# Patient Record
Sex: Female | Born: 1995 | Race: Black or African American
Health system: Southern US, Community
[De-identification: ages and names within clinical notes are randomized; demographics above are authoritative.]

## PROBLEM LIST (undated history)

## (undated) ENCOUNTER — Emergency Department
Admission: EM | Discharge status: Against Medical Advice | Payer: Enrolled Prime—HMO | Attending: Emergency Medicine | Admitting: Emergency Medicine

## (undated) DIAGNOSIS — F32A Depression, unspecified: Secondary | ICD-10-CM

## (undated) DIAGNOSIS — F419 Anxiety disorder, unspecified: Secondary | ICD-10-CM

## (undated) DIAGNOSIS — N390 Urinary tract infection, site not specified: Secondary | ICD-10-CM

## (undated) DIAGNOSIS — R519 Headache, unspecified: Secondary | ICD-10-CM

## (undated) DIAGNOSIS — R51 Headache: Secondary | ICD-10-CM

## (undated) DIAGNOSIS — J45909 Unspecified asthma, uncomplicated: Secondary | ICD-10-CM

## (undated) DIAGNOSIS — J302 Other seasonal allergic rhinitis: Secondary | ICD-10-CM

## (undated) DIAGNOSIS — T781XXA Other adverse food reactions, not elsewhere classified, initial encounter: Secondary | ICD-10-CM

## (undated) DIAGNOSIS — J309 Allergic rhinitis, unspecified: Secondary | ICD-10-CM

## (undated) DIAGNOSIS — H1010 Acute atopic conjunctivitis, unspecified eye: Secondary | ICD-10-CM

## (undated) HISTORY — DX: Unspecified asthma, uncomplicated: J45.909

## (undated) HISTORY — DX: Headache, unspecified: R51.9

## (undated) HISTORY — PX: ANTERIOR CRUCIATE LIGAMENT REPAIR: SHX115

## (undated) HISTORY — DX: Headache: R51

## (undated) HISTORY — DX: Urinary tract infection, site not specified: N39.0

## (undated) HISTORY — DX: Acute atopic conjunctivitis, unspecified eye: H10.10

## (undated) HISTORY — DX: Other seasonal allergic rhinitis: J30.2

## (undated) HISTORY — DX: Other adverse food reactions, not elsewhere classified, initial encounter: T78.1XXA

## (undated) HISTORY — DX: Allergic rhinitis, unspecified: J30.9

## (undated) HISTORY — DX: Anxiety disorder, unspecified: F41.9

## (undated) HISTORY — PX: OTHER SURGICAL HISTORY: SHX169

---
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
## 2017-11-13 ENCOUNTER — Encounter: Payer: Self-pay | Admitting: Nurse Practitioner

## 2017-11-13 ENCOUNTER — Ambulatory Visit (INDEPENDENT_AMBULATORY_CARE_PROVIDER_SITE_OTHER): Payer: 59 | Admitting: Nurse Practitioner

## 2017-11-13 ENCOUNTER — Other Ambulatory Visit (HOSPITAL_COMMUNITY)
Admission: RE | Admit: 2017-11-13 | Discharge: 2017-11-13 | Disposition: A | Discharge status: Home / Self Care | Payer: 59 | Source: Ambulatory Visit | Attending: Nurse Practitioner | Admitting: Nurse Practitioner

## 2017-11-13 VITALS — BP 118/78 | HR 107 | Temp 98.3°F | Ht 70.0 in | Wt 156.0 lb

## 2017-11-13 DIAGNOSIS — A749 Chlamydial infection, unspecified: Secondary | ICD-10-CM | POA: Insufficient documentation

## 2017-11-13 DIAGNOSIS — B373 Candidiasis of vulva and vagina: Secondary | ICD-10-CM | POA: Diagnosis not present

## 2017-11-13 DIAGNOSIS — B9689 Other specified bacterial agents as the cause of diseases classified elsewhere: Secondary | ICD-10-CM | POA: Diagnosis not present

## 2017-11-13 DIAGNOSIS — N760 Acute vaginitis: Secondary | ICD-10-CM | POA: Diagnosis not present

## 2017-11-13 DIAGNOSIS — B3731 Acute candidiasis of vulva and vagina: Secondary | ICD-10-CM

## 2017-11-13 NOTE — Patient Instructions (Addendum)
Maintain appt with psychology.  Positive for BV, yeast and chalmydia. Diflucan, metrogel and azithromycin sent. Return to lab in 1week for repeat urine cytology  Coping With Depression, Teen Depression is an experience of feeling down, blue, or sad. Depression can affect your thoughts and feelings, relationships, daily activities, and physical health. It is caused by changes in your brain that can be triggered by stress in your life or a serious loss. Everyone experiences occasional disappointment, sadness, and loss in their lives. When you are feeling down, blue, or sad for at least 2 weeks in a row, it may mean that you have depression. If you receive a diagnosis of depression, your health care provider will tell you which type of depression you have and the possible treatments to help. How can depression affect me? Being depressed can make daily activities more difficult. It can negatively affect your daily life, from school and sports performance to work and relationships. When you are depressed, you may:  Want to be alone.  Avoid interacting with others.  Avoid doing the things you usually like to do.  Notice changes in your sleep habits.  Find it harder than usual to wake up and go to school or work.  Feel angry at everyone.  Feel like you do not have any patience.  Have trouble concentrating.  Feel tired all the time.  Notice changes in your appetite.  Lose or gain weight without trying.  Have constant headaches or stomachaches.  Think about death or attempting suicide often.  What are things I can do to deal with depression? If you have had symptoms of depression for more than 2 weeks, talk with your parents or an adult you trust, such as a Veterinary surgeon at school or church or a Psychologist, occupational. You might be tempted to only tell friends, but you should tell an adult too. The hardest step in dealing with depression is admitting that you are feeling it to someone. The more people who  know, the more likely you will be to get some help. Certain types of counseling can be very helpful in treating depression. A counseling professional can assess what treatments are going to be most helpful for you. These may include:  Talk therapy.  Medicines.  Brain stimulation therapy.  There are a number of other things you can do that can help you cope with depression on a daily basis, including:  Spending time in nature.  Spending time with trusted friends who help you feel better.  Taking time to think about the positive things in your life and to feel grateful for them.  Exercising, such as playing an active game with some friends or going for a run.  Spending less time using electronics, especially at night before bed. The screens of TVs, computers, tablets, and phones make your brain think it is time to get up rather than go to bed.  Avoiding spending too much time spacing out on TV or video games. This might feel good for a while, but it ends up just being a way to avoid the feelings of depression.  What should I do if my depression gets worse? If you are having trouble managing your depression or if your depression gets worse, talk to your health care provider about making adjustments to your treatment plan. You should get help immediately if:  You feel suicidal and are making a plan to commit suicide.  You are drinking or using drugs to stop the pain from your depression.  You are cutting yourself or thinking about cutting yourself.  You are thinking about hurting others and are making a plan to do so.  You believe the world would be better off without you in it.  You are isolating yourself completely and not talking with anyone.  If you find yourself in any of these situations, you should do one of the following:  Immediately tell your parents or best friend.  Call and go see your health care provider or health professional.  Call the suicide prevention  hotline (307-742-7347 in the [place]  Text the crisis line 250-162-4259 in the [place]  Where can I get support? It is important to know that although depression is serious, you can find support from a variety of sources. Sources of help may include:  Suicide prevention, crisis prevention, and depression hotlines.  School teachers, counselors, Systems developer, or clergy.  Parents or other family members.  Support groups.  You can locate a counselor or support group in your area from one of the following sources:  Mental Health America: www.mentalhealthamerica.net  Anxiety and Depression Association of [place] (ADAA): ProgramCam.de  The [employer] on Mental Illness (NAMI): www.nami.org  This information is not intended to replace advice given to you by your health care provider. Make sure you discuss any questions you have with your health care provider. Document Released: 02/03/2015 Document Revised: 06/22/2015 Document Reviewed: 02/03/2015 Elsevier Interactive Patient Education  [employer].

## 2017-11-13 NOTE — Progress Notes (Signed)
Subjective:  Patient ID: Megan Elliott, female    DOB: 1995-05-16  Age: 22 y.o. MRN: [id number]  CC: Establish Care (vaginal itching and dysuria)   HPI  urinary and vaginal symptoms: Current symptoms: itching, burning, no discharge since 11/01/2017. Denies any douching. LMP 10/05-10/11/2017. nexplanon inserted 06/2015. Irregular and menorrhagia.  Also reports anxiety and depression related to strained relationship with best friend. appt with psychology:11/21/2017. Depression screen [facility] 2/9 11/13/2017 11/13/2017  Decreased Interest 2 1  Down, Depressed, Hopeless 1 1  PHQ - 2 Score 3 2  Altered sleeping 1 -  Tired, decreased energy 2 -  Change in appetite 2 -  Feeling bad or failure about yourself  1 -  Trouble concentrating 3 -  Moving slowly or fidgety/restless 1 -  Suicidal thoughts 0 -  PHQ-9 Score 13 -   GAD 7 : Generalized Anxiety Score 11/13/2017  Nervous, Anxious, on Edge 3  Control/stop worrying 3  Worry too much - different things 3  Trouble relaxing 3  Restless 2  Easily annoyed or irritable 3  Afraid - awful might happen 2  Total GAD 7 Score 19   Reviewed past Medical, Social and Family history today.  Outpatient Medications Prior to Visit  Medication Sig Dispense Refill  . etonogestrel (NEXPLANON) 68 MG IMPL implant Nexplanon 68 mg subdermal implant     No facility-administered medications prior to visit.     ROS See HPI  Objective:  BP 118/78   Pulse (!) 107   Temp 98.3 F (36.8 C) (Oral)   Ht 5\' 10"  (1.778 m)   Wt 156 lb (70.8 kg)   SpO2 98%   BMI 22.38 kg/m   BP Readings from Last 3 Encounters:  11/13/17 118/78    Wt Readings from Last 3 Encounters:  11/13/17 156 lb (70.8 kg)    Physical Exam  Constitutional: She appears well-developed and well-nourished.  Cardiovascular: Normal rate.  Pulmonary/Chest: Effort normal.  Genitourinary: Pelvic exam was performed with patient supine. There is no rash, tenderness or lesion on the  right labia. There is no rash, tenderness or lesion on the left labia. Right adnexum displays no tenderness. Left adnexum displays no tenderness. There is erythema in the vagina. Vaginal discharge found.  Genitourinary Comments: Chaperone present  Lymphadenopathy: No inguinal adenopathy noted on the right or left side.  Vitals reviewed.  No results found for: WBC, HGB, HCT, PLT, GLUCOSE, CHOL, TRIG, HDL, LDLDIRECT, LDLCALC, ALT, AST, NA, K, CL, CREATININE, BUN, CO2, TSH, PSA, INR, GLUF, HGBA1C, MICROALBUR  Assessment & Plan:   Megan Elliott was seen today for establish care.  Diagnoses and all orders for this visit:  BV (bacterial vaginosis) -     Cervicovaginal ancillary only( [place]) -     Urinalysis w microscopic + reflex cultur -     metroNIDAZOLE (METROGEL VAGINAL) 0.75 % vaginal gel; Place 1 Applicatorful vaginally 2 (two) times daily.  Chlamydia infection -     Urinalysis w microscopic + reflex cultur -     REFLEXIVE URINE CULTURE -     Urine cytology ancillary only([place]); Future -     azithromycin (ZITHROMAX) 500 MG tablet; Take 2 tablets (1,000 mg total) by mouth once for 1 dose.  Yeast vaginitis -     fluconazole (DIFLUCAN) 150 MG tablet; Take 1 tablet (150 mg total) by mouth daily. Take second tab 3days apart from first tab   I am having Megan Elliott start on fluconazole, metroNIDAZOLE, and azithromycin. I am  also having her maintain her etonogestrel.  Meds ordered this encounter  Medications  . fluconazole (DIFLUCAN) 150 MG tablet    Sig: Take 1 tablet (150 mg total) by mouth daily. Take second tab 3days apart from first tab    Dispense:  2 tablet    Refill:  0    Order Specific Question:   Supervising Provider    Answer:   Dianne Dun [[id number]]  . metroNIDAZOLE (METROGEL VAGINAL) 0.75 % vaginal gel    Sig: Place 1 Applicatorful vaginally 2 (two) times daily.    Dispense:  70 g    Refill:  0    Order Specific Question:   Supervising Provider    Answer:    Dianne Dun [[id number]]  . azithromycin (ZITHROMAX) 500 MG tablet    Sig: Take 2 tablets (1,000 mg total) by mouth once for 1 dose.    Dispense:  2 tablet    Refill:  0    Order Specific Question:   Supervising Provider    Answer:   Dianne Dun [[id number]]    Follow-up: Return in about 2 weeks (around 11/27/2017) for CPE(fasting) and mood disorder.  [name], NP

## 2017-11-14 ENCOUNTER — Telehealth: Payer: Self-pay

## 2017-11-14 LAB — URINALYSIS W MICROSCOPIC + REFLEX CULTURE
BACTERIA UA: NONE SEEN /HPF
Bilirubin Urine: NEGATIVE
Glucose, UA: NEGATIVE
Hgb urine dipstick: NEGATIVE
Hyaline Cast: NONE SEEN /LPF
KETONES UR: NEGATIVE
LEUKOCYTE ESTERASE: NEGATIVE
Nitrites, Initial: NEGATIVE
PROTEIN: NEGATIVE
RBC / HPF: NONE SEEN /HPF (ref 0–2)
SPECIFIC GRAVITY, URINE: 1.031 (ref 1.001–1.03)
pH: 7 (ref 5.0–8.0)

## 2017-11-14 LAB — CERVICOVAGINAL ANCILLARY ONLY
BACTERIAL VAGINITIS: POSITIVE — AB
CHLAMYDIA, DNA PROBE: POSITIVE — AB
Candida vaginitis: POSITIVE — AB
Neisseria Gonorrhea: NEGATIVE
Trichomonas: NEGATIVE

## 2017-11-14 LAB — NO CULTURE INDICATED

## 2017-11-14 NOTE — Telephone Encounter (Signed)
I understand. Proceed with the other tests

## 2017-11-14 NOTE — Telephone Encounter (Signed)
CN-Megan Elliott at [facility] stated that she cannot run the HPV that was ordered with the swab sent to them/she said that you can use the Thin Prep vials for all of the tests including the HPV without actually doing a PAP FYI/thx dmf

## 2017-11-17 ENCOUNTER — Encounter: Payer: Self-pay | Admitting: Nurse Practitioner

## 2017-11-17 MED ORDER — AZITHROMYCIN 500 MG PO TABS: 1000.0000 mg | ORAL_TABLET | Freq: Once | ORAL | 0 refills | Status: AC

## 2017-11-17 MED ORDER — FLUCONAZOLE 150 MG PO TABS: 150.0000 mg | ORAL_TABLET | Freq: Every day | ORAL | 0 refills | Status: DC

## 2017-11-17 MED ORDER — METRONIDAZOLE 0.75 % VA GEL: 1.0000 | Freq: Two times a day (BID) | VAGINAL | 0 refills | Status: DC

## 2017-11-24 ENCOUNTER — Other Ambulatory Visit (HOSPITAL_COMMUNITY)
Admission: RE | Admit: 2017-11-24 | Discharge: 2017-11-24 | Disposition: A | Discharge status: Home / Self Care | Source: Ambulatory Visit | Attending: Nurse Practitioner | Admitting: Nurse Practitioner

## 2017-11-24 ENCOUNTER — Other Ambulatory Visit (INDEPENDENT_AMBULATORY_CARE_PROVIDER_SITE_OTHER): Payer: 59

## 2017-11-24 DIAGNOSIS — B373 Candidiasis of vulva and vagina: Secondary | ICD-10-CM | POA: Insufficient documentation

## 2017-11-24 DIAGNOSIS — N760 Acute vaginitis: Secondary | ICD-10-CM | POA: Insufficient documentation

## 2017-11-24 DIAGNOSIS — B9689 Other specified bacterial agents as the cause of diseases classified elsewhere: Secondary | ICD-10-CM | POA: Diagnosis present

## 2017-11-24 DIAGNOSIS — A749 Chlamydial infection, unspecified: Secondary | ICD-10-CM

## 2017-11-25 LAB — URINE CYTOLOGY ANCILLARY ONLY
Chlamydia: NEGATIVE
NEISSERIA GONORRHEA: NEGATIVE
TRICH (WINDOWPATH): NEGATIVE

## 2017-11-27 ENCOUNTER — Encounter: Payer: Self-pay | Admitting: Nurse Practitioner

## 2017-11-27 ENCOUNTER — Ambulatory Visit (INDEPENDENT_AMBULATORY_CARE_PROVIDER_SITE_OTHER): Payer: 59 | Admitting: Nurse Practitioner

## 2017-11-27 VITALS — BP 120/72 | HR 69 | Temp 99.0°F | Ht 70.0 in | Wt 162.0 lb

## 2017-11-27 DIAGNOSIS — R590 Localized enlarged lymph nodes: Secondary | ICD-10-CM | POA: Diagnosis not present

## 2017-11-27 DIAGNOSIS — F329 Major depressive disorder, single episode, unspecified: Secondary | ICD-10-CM

## 2017-11-27 DIAGNOSIS — J302 Other seasonal allergic rhinitis: Secondary | ICD-10-CM | POA: Diagnosis not present

## 2017-11-27 DIAGNOSIS — Z114 Encounter for screening for human immunodeficiency virus [HIV]: Secondary | ICD-10-CM

## 2017-11-27 DIAGNOSIS — Z7251 High risk heterosexual behavior: Secondary | ICD-10-CM | POA: Diagnosis not present

## 2017-11-27 DIAGNOSIS — Z0001 Encounter for general adult medical examination with abnormal findings: Secondary | ICD-10-CM | POA: Diagnosis not present

## 2017-11-27 DIAGNOSIS — F32A Depression, unspecified: Secondary | ICD-10-CM | POA: Insufficient documentation

## 2017-11-27 MED ORDER — ESCITALOPRAM OXALATE 10 MG PO TABS: 10.0000 mg | ORAL_TABLET | Freq: Every day | ORAL | 1 refills | Status: DC

## 2017-11-27 MED ORDER — SALINE SPRAY 0.65 % NA SOLN: 1.0000 | NASAL | 0 refills | Status: DC | PRN

## 2017-11-27 MED ORDER — CETIRIZINE HCL 10 MG PO TABS: 10.0000 mg | ORAL_TABLET | Freq: Every day | ORAL | 0 refills | Status: DC

## 2017-11-27 MED ORDER — FLUTICASONE PROPIONATE 50 MCG/ACT NA SUSP: 2.0000 | Freq: Every day | NASAL | 0 refills | Status: DC

## 2017-11-27 NOTE — Patient Instructions (Addendum)
I instructed pt to start 1/2 tablet once daily for 1 week and then increase to a full tablet once daily on week two as tolerated.   We discussed common side effects such as nausea, drowsiness and weight gain.  Also discussed rare but serious side effect of suicide ideation.  She is instructed to discontinue medication go directly to ED if this occurs.  Pt verbalizes understanding.   Plan follow up in 2weeks to evaluate progress.    Normal lab results.  Preventive Care for [place], Female The transition to life after high school as a young adult can be a stressful time with many changes. You may start seeing a primary care physician instead of a pediatrician. This is the time when your health care becomes your responsibility. Preventive care refers to lifestyle choices and visits with your health care provider that can promote health and wellness. What does preventive care include?  A yearly physical exam. This is also called an annual wellness visit.  Dental exams once or twice a year.  Routine eye exams. Ask your health care provider how often you should have your eyes checked.  Personal lifestyle choices, including: ? Daily care of your teeth and gums. ? Regular physical activity. ? Eating a healthy diet. ? Avoiding tobacco and drug use. ? Avoiding or limiting alcohol use. ? Practicing safe sex. ? Taking vitamin and mineral supplements as recommended by your health care provider. What happens during an annual wellness visit? Preventive care starts with a yearly visit to your primary care physician. The services and screenings done by your health care provider during your annual wellness visit will depend on your overall health, lifestyle risk factors, and family history of disease. Counseling Your health care provider may ask you questions about:  Past medical problems and your family's medical history.  Medicines or supplements you take.  Health insurance and access to  health care.  Alcohol, tobacco, and drug use.  Your safety at home, work, or school.  Access to firearms.  Emotional well-being and how you cope with stress.  Relationship well-being.  Diet, exercise, and sleep habits.  Your sexual health and activity.  Your methods of birth control.  Your menstrual cycle.  Your pregnancy history.  Screening You may have the following tests or measurements:  Height, weight, and BMI.  Blood pressure.  Lipid and cholesterol levels.  Tuberculosis skin test.  Skin exam.  Vision and hearing tests.  Screening test for hepatitis.  Screening tests for sexually transmitted diseases (STDs), if you are at risk.  BRCA-related cancer screening. This may be done if you have a family history of breast, ovarian, tubal, or peritoneal cancers.  Pelvic exam and Pap test. This may be done every 3 years starting at age 1.  Vaccines Your health care provider may recommend certain vaccines, such as:  Influenza vaccine. This is recommended every year.  Tetanus, diphtheria, and acellular pertussis (Tdap, Td) vaccine. You may need a Td booster every 10 years.  Varicella vaccine. You may need this if you have not been vaccinated.  HPV vaccine. If you are 65 or younger, you may need three doses over 6 months.  Measles, mumps, and rubella (MMR) vaccine. You may need at least one dose of MMR. You may also need a second dose.  Pneumococcal 13-valent conjugate (PCV13) vaccine. You may need this if you have certain conditions and were not previously vaccinated.  Pneumococcal polysaccharide (PPSV23) vaccine. You may need one or two doses if  you smoke cigarettes or if you have certain conditions.  Meningococcal vaccine. One dose is recommended if you are age 20-21 years and a first-year college student living in a residence hall, or if you have one of several medical conditions. You may also need additional booster doses.  Hepatitis A vaccine. You may  need this if you have certain conditions or if you travel or work in places where you may be exposed to hepatitis A.  Hepatitis B vaccine. You may need this if you have certain conditions or if you travel or work in places where you may be exposed to hepatitis B.  Haemophilus influenzae type b (Hib) vaccine. You may need this if you have certain risk factors.  Talk to your health care provider about which screenings and vaccines you need and how often you need them. What steps can I take to develop healthy behaviors?  Have regular preventive health care visits with your primary care physician and dentist.  Eat a healthy diet.  Drink enough fluid to keep your urine clear or pale yellow.  Stay active. Exercise at least 30 minutes 5 or more days of the week.  Use alcohol responsibly.  Maintain a healthy weight.  Do not use any products that contain nicotine, such as cigarettes, chewing tobacco, and e-cigarettes. If you need help quitting, ask your health care provider.  Do not use drugs.  Practice safe sex.  Use birth control (contraception) to prevent unwanted pregnancy. If you plan to become pregnant, see your health care provider for a pre-conception visit.  Find healthy ways to manage stress. How can I protect myself from injury? Injuries from violence or accidents are the leading cause of death among young adults and can often be prevented. Take these steps to help protect yourself:  Always wear your seat belt while driving or riding in a vehicle.  Do not drive if you have been drinking alcohol. Do not ride with someone who has been drinking.  Do not drive when you are tired or distracted. Do not text while driving.  Wear a helmet and other protective equipment during sports activities.  If you have firearms in your house, make sure you follow all gun safety procedures.  Seek help if you have been bullied, physically abused, or sexually abused.  Use the Internet  responsibly to avoid dangers such as online bullying and online sexual predators.  What can I do to cope with stress? Young adults may face many new challenges that can be stressful, such as finding a job, going to college, moving away from home, managing money, being in a relationship, getting married, and having children. To manage stress:  Avoid known stressful situations when you can.  Exercise regularly.  Find a stress-reducing activity that works best for you. Examples include meditation, yoga, listening to music, or reading.  Spend time in nature.  Keep a journal to write about your stress and how you respond.  Talk to your health care provider about stress. He or she may suggest counseling.  Spend time with supportive friends or family.  Do not cope with stress by: ? Drinking alcohol or using drugs. ? Smoking cigarettes. ? Eating.  Where can I get more information? Learn more about preventive care and healthy habits from:  [place] and Gynecologists: KaraokeExchange.nl  U.S. Probation officer Task Force: StageSync.si  National Adolescent and [place]: StrategicRoad.nl  American Academy of Pediatrics Bright Futures: https://brightfutures.MemberVerification.co.za  Society for Adolescent Health  and Medicine: MoralBlog.co.za.aspx  PodExchange.nl: ToyLending.fr  This information is not intended to replace advice given to you by your health care provider. Make sure you discuss any questions you have with your health care provider. Document Released: 06/01/2015 Document Revised: 06/22/2015 Document Reviewed: 06/01/2015 Elsevier Interactive Patient Education  [employer].

## 2017-11-27 NOTE — Progress Notes (Signed)
Subjective:    Patient ID: Megan Elliott, female    DOB: [birth year], 22 y.o.   MRN: [id number]  Patient presents today for complete physical and depression evaluation.  HPI  Sexual History (orientation,birth control, marital status, STD): sexually active, unprotected, reports partner has not yet been treated.  Depression/Suicide:  Ongoing for several months, waxing and waning Related to strained relationship with high school friend and boyfriend. Denies any hx of abuse (sexual, physical, emotional or verbal) Denies any drug use. Seen by psychology: Charlcie Cradle in [place], next appt 12/12/2017. Depression screen [facility] 2/9 11/13/2017 11/13/2017  Decreased Interest 2 1  Down, Depressed, Hopeless 1 1  PHQ - 2 Score 3 2  Altered sleeping 1 -  Tired, decreased energy 2 -  Change in appetite 2 -  Feeling bad or failure about yourself  1 -  Trouble concentrating 3 -  Moving slowly or fidgety/restless 1 -  Suicidal thoughts 0 -  PHQ-9 Score 13 -   Vision:up to date  Dental:up to date  Immunizations: (TDAP, Hep C screen, Pneumovax, Influenza, zoster)  Health Maintenance  Topic Date Due  . HIV Screening  08/28/2012  . Flu Shot  04/27/2018*  . Chlamydia screening  11/14/2018  . Tetanus Vaccine  07/16/2025  *Topic was postponed. The date shown is not the original due date.   Diet:regular.  Weight:  Wt Readings from Last 3 Encounters:  11/27/17 162 lb (73.5 kg)  11/13/17 156 lb (70.8 kg)   Exercise:none  Fall Risk: Fall Risk  11/13/2017  Falls in the past year? No    Medications and allergies reviewed with patient and updated if appropriate.  Patient Active Problem List   Diagnosis Date Noted  . Depression 11/27/2017  . Posterior cervical lymphadenopathy 11/27/2017  . High-risk sexual behavior 11/27/2017    Current Outpatient Medications on File Prior to Visit  Medication Sig Dispense Refill  . etonogestrel (NEXPLANON) 68 MG IMPL implant Nexplanon 68 mg  subdermal implant     No current facility-administered medications on file prior to visit.     Past Medical History:  Diagnosis Date  . Asthma   . Headache   . UTI (urinary tract infection)     No past surgical history on file.  Social History   Socioeconomic History  . Marital status: Single    Spouse name: Not on file  . Number of children: 0  . Years of education: Not on file  . Highest education level: Not on file  Occupational History    Comment: Pharmacist, hospital  Social Needs  . Financial resource strain: Not on file  . Food insecurity:    Worry: Not on file    Inability: Not on file  . Transportation needs:    Medical: Not on file    Non-medical: Not on file  Tobacco Use  . Smoking status: Never Smoker  . Smokeless tobacco: Never Used  Substance and Sexual Activity  . Alcohol use: Yes    Comment: social  . Drug use: Never  . Sexual activity: Yes    Birth control/protection: Implant  Lifestyle  . Physical activity:    Days per week: Not on file    Minutes per session: Not on file  . Stress: Not on file  Relationships  . Social connections:    Talks on phone: More than three times a week    Gets together: More than three times a week    Attends religious service: Patient refused  Active member of club or organization: No    Attends meetings of clubs or organizations: Never    Relationship status: Not on file  Other Topics Concern  . Not on file  Social History Narrative  . Not on file    Family History  Problem Relation Age of Onset  . Hypertension Father   . Diabetes Maternal Grandmother   . Epilepsy Paternal Grandmother         Review of Systems  Constitutional: Negative for fever, malaise/fatigue and weight loss.  HENT: Positive for congestion. Negative for sore throat.   Eyes:       Negative for visual changes  Respiratory: Negative for cough and shortness of breath.   Cardiovascular: Negative for chest pain,  palpitations and leg swelling.  Gastrointestinal: Negative for blood in stool, constipation, diarrhea and heartburn.  Genitourinary: Negative for dysuria, frequency and urgency.  Musculoskeletal: Negative for falls, joint pain and myalgias.  Skin: Negative for rash.  Neurological: Negative for dizziness, sensory change and headaches.  Endo/Heme/Allergies: Does not bruise/bleed easily.  Psychiatric/Behavioral: Negative for depression, substance abuse and suicidal ideas. The patient is not nervous/anxious.     Objective:   Vitals:   11/27/17 1501  BP: 120/72  Pulse: 69  Temp: 99 F (37.2 C)  SpO2: 99%    Body mass index is 23.24 kg/m.   Physical Examination:  Physical Exam  Constitutional: She is oriented to person, place, and time.  HENT:  Right Ear: Tympanic membrane, external ear and ear canal normal.  Left Ear: Tympanic membrane, external ear and ear canal normal.  Nose: Mucosal edema and rhinorrhea present. Right sinus exhibits maxillary sinus tenderness. Right sinus exhibits no frontal sinus tenderness. Left sinus exhibits maxillary sinus tenderness. Left sinus exhibits no frontal sinus tenderness.  Mouth/Throat: Uvula is midline. Posterior oropharyngeal erythema present. No oropharyngeal exudate. Tonsils are 1+ on the right. Tonsils are 1+ on the left.  Eyes: Pupils are equal, round, and reactive to light. Conjunctivae and EOM are normal.  Neck: Normal range of motion. Neck supple. No thyromegaly present.  Cardiovascular: Normal rate, regular rhythm, normal heart sounds and intact distal pulses.  Pulmonary/Chest: Effort normal and breath sounds normal. She exhibits tenderness. She exhibits no mass. Right breast exhibits no inverted nipple, no mass, no nipple discharge, no skin change and no tenderness. Left breast exhibits no inverted nipple, no mass, no nipple discharge, no skin change and no tenderness.  Abdominal: Soft. Bowel sounds are normal.  Genitourinary:    Genitourinary Comments: Deferred to GYN  Musculoskeletal: Normal range of motion. She exhibits no edema.  Lymphadenopathy:    She has cervical adenopathy.  Neurological: She is alert and oriented to person, place, and time.  Skin: No rash noted.  Psychiatric: She has a normal mood and affect. Her speech is normal and behavior is normal. Thought content normal. Cognition and memory are normal. She expresses no homicidal and no suicidal ideation.   ASSESSMENT and PLAN:  Katrenia was seen today for annual exam.  Diagnoses and all orders for this visit:  Encounter for preventative adult health care exam with abnormal findings -     Comprehensive metabolic panel -     CBC -     TSH -     HIV Antibody (routine testing w rflx)  Depression, unspecified depression type -     escitalopram (LEXAPRO) 10 MG tablet; Take 1 tablet (10 mg total) by mouth daily.  Seasonal allergic rhinitis, unspecified trigger -  cetirizine (ZYRTEC) 10 MG tablet; Take 1 tablet (10 mg total) by mouth daily. -     fluticasone (FLONASE) 50 MCG/ACT nasal spray; Place 2 sprays into both nostrils daily. -     sodium chloride (OCEAN) 0.65 % SOLN nasal spray; Place 1 spray into both nostrils as needed for congestion.  Encounter for screening for HIV -     HIV Antibody (routine testing w rflx)  Posterior cervical lymphadenopathy -     Epstein-Barr virus VCA, IgM  High risk heterosexual behavior   No problem-specific Assessment & Plan notes found for this encounter.     Follow up: Return in about 2 weeks (around 12/11/2017) for depression ( slot).  [name], NP

## 2017-11-28 ENCOUNTER — Encounter: Payer: Self-pay | Admitting: Nurse Practitioner

## 2017-11-28 LAB — TSH: TSH: 1.15 u[IU]/mL (ref 0.35–5.50)

## 2017-11-28 LAB — COMPREHENSIVE METABOLIC PANEL
ALT: 13 U/L (ref 0–35)
AST: 18 U/L (ref 0–37)
Albumin: 4.5 g/dL (ref 3.5–5.2)
Alkaline Phosphatase: 39 U/L (ref 39–117)
BILIRUBIN TOTAL: 1.8 mg/dL — AB (ref 0.2–1.2)
BUN: 11 mg/dL (ref 6–23)
CALCIUM: 9.8 mg/dL (ref 8.4–10.5)
CHLORIDE: 105 meq/L (ref 96–112)
CO2: 29 meq/L (ref 19–32)
Creatinine, Ser: 0.82 mg/dL (ref 0.40–1.20)
GFR: 114.01 mL/min (ref >60.00)
GLUCOSE: 83 mg/dL (ref 70–99)
POTASSIUM: 4.1 meq/L (ref 3.5–5.1)
Sodium: 144 mEq/L (ref 135–145)
Total Protein: 7.4 g/dL (ref 6.0–8.3)

## 2017-11-28 LAB — HIV ANTIBODY (ROUTINE TESTING W REFLEX): HIV 1&2 Ab, 4th Generation: NONREACTIVE

## 2017-11-28 LAB — CBC
HEMATOCRIT: 40.5 % (ref 36.0–46.0)
HEMOGLOBIN: 13.1 g/dL (ref 12.0–15.0)
MCHC: 32.4 g/dL (ref 30.0–36.0)
MCV: 86.1 fl (ref 78.0–100.0)
PLATELETS: 206 10*3/uL (ref 150.0–400.0)
RBC: 4.7 Mil/uL (ref 3.87–5.11)
RDW: 13.8 % (ref 11.5–14.6)
WBC: 5.4 10*3/uL (ref 4.5–10.5)

## 2017-11-28 LAB — EPSTEIN-BARR VIRUS VCA, IGM

## 2017-12-11 ENCOUNTER — Ambulatory Visit: Admitting: Nurse Practitioner

## 2017-12-16 ENCOUNTER — Ambulatory Visit (INDEPENDENT_AMBULATORY_CARE_PROVIDER_SITE_OTHER): Payer: 59 | Admitting: Nurse Practitioner

## 2017-12-16 ENCOUNTER — Encounter: Payer: Self-pay | Admitting: Nurse Practitioner

## 2017-12-16 DIAGNOSIS — F341 Dysthymic disorder: Secondary | ICD-10-CM

## 2017-12-16 MED ORDER — ESCITALOPRAM OXALATE 10 MG PO TABS: 10.0000 mg | ORAL_TABLET | Freq: Every day | ORAL | 1 refills | Status: DC

## 2017-12-16 NOTE — Progress Notes (Signed)
Subjective:  Patient ID: Megan Elliott, female    DOB: [birth year]  Age: 22 y.o. MRN: [id number]  CC: Follow-up (2 wks f/u. depression is getting better. )   HPI  Depression: Reports improved mood with lexapro Denies any adverse effects. Sessions with counselor every 2weeks. Depression screen [facility] 2/9 11/13/2017 11/13/2017  Decreased Interest 2 1  Down, Depressed, Hopeless 1 1  PHQ - 2 Score 3 2  Altered sleeping 1 -  Tired, decreased energy 2 -  Change in appetite 2 -  Feeling bad or failure about yourself  1 -  Trouble concentrating 3 -  Moving slowly or fidgety/restless 1 -  Suicidal thoughts 0 -  PHQ-9 Score 13 -   GAD 7 : Generalized Anxiety Score 11/13/2017  Nervous, Anxious, on Edge 3  Control/stop worrying 3  Worry too much - different things 3  Trouble relaxing 3  Restless 2  Easily annoyed or irritable 3  Afraid - awful might happen 2  Total GAD 7 Score 19   Reviewed past Medical, Social and Family history today.  Outpatient Medications Prior to Visit  Medication Sig Dispense Refill  . cetirizine (ZYRTEC) 10 MG tablet Take 1 tablet (10 mg total) by mouth daily. 30 tablet 0  . fluticasone (FLONASE) 50 MCG/ACT nasal spray Place 2 sprays into both nostrils daily. 16 g 0  . sodium chloride (OCEAN) 0.65 % SOLN nasal spray Place 1 spray into both nostrils as needed for congestion. 15 mL 0  . escitalopram (LEXAPRO) 10 MG tablet Take 1 tablet (10 mg total) by mouth daily. 30 tablet 1  . IUD'S IU by Intrauterine route. Placed on 12/04/2017    . levonorgestrel (MIRENA, 52 MG,) 20 MCG/24HR IUD 1 Intra Uterine Device (1 each total) by Intrauterine route once for 1 dose. Inserted 12/04/2017 by [facility] 1 each 0  . etonogestrel (NEXPLANON) 68 MG IMPL implant Nexplanon 68 mg subdermal implant     No facility-administered medications prior to visit.     ROS See HPI  Objective:  BP 112/70   Pulse 89   Temp 97.7 F (36.5 C) (Oral)   Ht 5\' 10"  (1.778 m)    Wt 166 lb (75.3 kg)   SpO2 100%   BMI 23.82 kg/m   BP Readings from Last 3 Encounters:  12/16/17 112/70  11/27/17 120/72  11/13/17 118/78    Wt Readings from Last 3 Encounters:  12/16/17 166 lb (75.3 kg)  11/27/17 162 lb (73.5 kg)  11/13/17 156 lb (70.8 kg)    Physical Exam  Constitutional: She is oriented to person, place, and time. She appears well-developed and well-nourished.  Cardiovascular: Normal rate.  Pulmonary/Chest: Effort normal.  Neurological: She is alert and oriented to person, place, and time.  Psychiatric: She has a normal mood and affect. Her behavior is normal.  Vitals reviewed.   Lab Results  Component Value Date   WBC 5.4 11/27/2017   HGB 13.1 11/27/2017   HCT 40.5 11/27/2017   PLT 206.0 11/27/2017   GLUCOSE 83 11/27/2017   ALT 13 11/27/2017   AST 18 11/27/2017   NA 144 11/27/2017   K 4.1 11/27/2017   CL 105 11/27/2017   CREATININE 0.82 11/27/2017   BUN 11 11/27/2017   CO2 29 11/27/2017   TSH 1.15 11/27/2017    Assessment & Plan:   Merced was seen today for follow-up.  Diagnoses and all orders for this visit:  Dysthymia -     escitalopram (LEXAPRO) 10  MG tablet; Take 1 tablet (10 mg total) by mouth daily.   I have discontinued Khloe Santucci's etonogestrel and IUD'S IU. I am also having her maintain her cetirizine, fluticasone, sodium chloride, escitalopram, and levonorgestrel.  Meds ordered this encounter  Medications  . escitalopram (LEXAPRO) 10 MG tablet    Sig: Take 1 tablet (10 mg total) by mouth daily.    Dispense:  90 tablet    Refill:  1    Order Specific Question:   Supervising Provider    Answer:   MATTHEWS, CODY [[id number]]    Problem List Items Addressed This Visit      Other   Depression   Relevant Medications   escitalopram (LEXAPRO) 10 MG tablet       Follow-up: Return in about 2 months (around 02/15/2018) for Depression.  Alysia Pennaharlotte Nche, NP

## 2017-12-16 NOTE — Patient Instructions (Signed)
Coping With Depression, Teen Depression is an experience of feeling down, blue, or sad. Depression can affect your thoughts and feelings, relationships, daily activities, and physical health. It is caused by changes in your brain that can be triggered by stress in your life or a serious loss. Everyone experiences occasional disappointment, sadness, and loss in their lives. When you are feeling down, blue, or sad for at least 2 weeks in a row, it may mean that you have depression. If you receive a diagnosis of depression, your health care provider will tell you which type of depression you have and the possible treatments to help. How can depression affect me? Being depressed can make daily activities more difficult. It can negatively affect your daily life, from school and sports performance to work and relationships. When you are depressed, you may:  Want to be alone.  Avoid interacting with others.  Avoid doing the things you usually like to do.  Notice changes in your sleep habits.  Find it harder than usual to wake up and go to school or work.  Feel angry at everyone.  Feel like you do not have any patience.  Have trouble concentrating.  Feel tired all the time.  Notice changes in your appetite.  Lose or gain weight without trying.  Have constant headaches or stomachaches.  Think about death or attempting suicide often.  What are things I can do to deal with depression? If you have had symptoms of depression for more than 2 weeks, talk with your parents or an adult you trust, such as a counselor at school or church or a coach. You might be tempted to only tell friends, but you should tell an adult too. The hardest step in dealing with depression is admitting that you are feeling it to someone. The more people who know, the more likely you will be to get some help. Certain types of counseling can be very helpful in treating depression. A counseling professional can assess what  treatments are going to be most helpful for you. These may include:  Talk therapy.  Medicines.  Brain stimulation therapy.  There are a number of other things you can do that can help you cope with depression on a daily basis, including:  Spending time in nature.  Spending time with trusted friends who help you feel better.  Taking time to think about the positive things in your life and to feel grateful for them.  Exercising, such as playing an active game with some friends or going for a run.  Spending less time using electronics, especially at night before bed. The screens of TVs, computers, tablets, and phones make your brain think it is time to get up rather than go to bed.  Avoiding spending too much time spacing out on TV or video games. This might feel good for a while, but it ends up just being a way to avoid the feelings of depression.  What should I do if my depression gets worse? If you are having trouble managing your depression or if your depression gets worse, talk to your health care provider about making adjustments to your treatment plan. You should get help immediately if:  You feel suicidal and are making a plan to commit suicide.  You are drinking or using drugs to stop the pain from your depression.  You are cutting yourself or thinking about cutting yourself.  You are thinking about hurting others and are making a plan to do so.  You   believe the world would be better off without you in it.  You are isolating yourself completely and not talking with anyone.  If you find yourself in any of these situations, you should do one of the following:  Immediately tell your parents or best friend.  Call and go see your health care provider or health professional.  Call the suicide prevention hotline (1-800-273-8255 in the [place]  Text the crisis line (741741 in the [place]  Where can I get support? It is important to know that although depression is  serious, you can find support from a variety of sources. Sources of help may include:  Suicide prevention, crisis prevention, and depression hotlines.  School teachers, counselors, coaches, or clergy.  Parents or other family members.  Support groups.  You can locate a counselor or support group in your area from one of the following sources:  Mental Health [place]: www.mentalhealthamerica.net  Anxiety and Depression Association of America (ADAA): www.adaa.org  National Alliance on Mental Illness (NAMI): www.nami.org  This information is not intended to replace advice given to you by your health care provider. Make sure you discuss any questions you have with your health care provider. Document Released: 02/03/2015 Document Revised: 06/22/2015 Document Reviewed: 02/03/2015 Elsevier Interactive Patient Education  2018 Elsevier Inc.  

## 2017-12-30 ENCOUNTER — Encounter: Payer: Self-pay | Admitting: Nurse Practitioner

## 2017-12-30 ENCOUNTER — Other Ambulatory Visit (HOSPITAL_COMMUNITY)
Admission: RE | Admit: 2017-12-30 | Discharge: 2017-12-30 | Disposition: A | Discharge status: Home / Self Care | Payer: 59 | Source: Ambulatory Visit | Attending: Nurse Practitioner | Admitting: Nurse Practitioner

## 2017-12-30 ENCOUNTER — Ambulatory Visit: Payer: 59 | Admitting: Nurse Practitioner

## 2017-12-30 ENCOUNTER — Ambulatory Visit (INDEPENDENT_AMBULATORY_CARE_PROVIDER_SITE_OTHER): Payer: 59 | Admitting: Nurse Practitioner

## 2017-12-30 VITALS — BP 112/70 | HR 82 | Temp 97.6°F | Ht 70.0 in | Wt 163.0 lb

## 2017-12-30 DIAGNOSIS — Z7251 High risk heterosexual behavior: Secondary | ICD-10-CM

## 2017-12-30 DIAGNOSIS — F341 Dysthymic disorder: Secondary | ICD-10-CM | POA: Diagnosis not present

## 2017-12-30 DIAGNOSIS — A749 Chlamydial infection, unspecified: Secondary | ICD-10-CM | POA: Diagnosis not present

## 2017-12-30 DIAGNOSIS — N898 Other specified noninflammatory disorders of vagina: Secondary | ICD-10-CM | POA: Insufficient documentation

## 2017-12-30 MED ORDER — BUSPIRONE HCL 7.5 MG PO TABS: 7.5000 mg | ORAL_TABLET | Freq: Three times a day (TID) | ORAL | 1 refills | Status: DC | PRN

## 2017-12-30 MED ORDER — METRONIDAZOLE 500 MG PO TABS: 500.0000 mg | ORAL_TABLET | Freq: Two times a day (BID) | ORAL | 0 refills | Status: DC

## 2017-12-30 NOTE — Patient Instructions (Addendum)
Use buspar in combination with lexapro. Use buspar as needed for panic attacks.  Make f/up in January for anxiety and depression.  You will be contacted with lab results.   Safe Sex Practicing safe sex means taking steps before and during sex to reduce your risk of:  Getting an STD (sexually transmitted disease).  Giving your partner an STD.  Unwanted pregnancy.  How can I practice safe sex?  To practice safe sex:  Limit your sexual partners to only one partner who is having sex with only you.  Avoid using alcohol and recreational drugs before having sex. These substances can affect your judgment.  Before having sex with a new partner: ? Talk to your partner about past partners, past STDs, and drug use. ? You and your partner should be screened for STDs and discuss the results with each other.  Check your body regularly for sores, blisters, rashes, or unusual discharge. If you notice any of these problems, visit your health care provider.  If you have symptoms of an infection or you are being treated for an STD, avoid sexual contact.  While having sex, use a condom. Make sure to: ? Use a condom every time you have vaginal, oral, or anal sex. Both females and males should wear condoms during oral sex. ? Keep condoms in place from the beginning to the end of sexual activity. ? Use a latex condom, if possible. Latex condoms offer the best protection. ? Use only water-based lubricants or oils to lubricate a condom. Using petroleum-based lubricants or oils will weaken the condom and increase the chance that it will break.  See your health care provider for regular screenings, exams, and tests for STDs.  Talk with your health care provider about the form of birth control (contraception) that is best for you.  Get vaccinated against hepatitis B and human papillomavirus (HPV).  If you are at risk of being infected with HIV (human immunodeficiency virus), talk with your health  care provider about taking a prescription medicine to prevent HIV infection. You are considered at risk for HIV if: ? You are a man who has sex with other men. ? You are a heterosexual man or woman who is sexually active with more than one partner. ? You take drugs by injection. ? You are sexually active with a partner who has HIV.  This information is not intended to replace advice given to you by your health care provider. Make sure you discuss any questions you have with your health care provider. Document Released: 02/22/2004 Document Revised: 05/31/2015 Document Reviewed: 12/04/2014 Elsevier Interactive Patient Education  [employer]2018 Else[employer] Inc.

## 2017-12-30 NOTE — Progress Notes (Addendum)
Subjective:  Patient ID: Megan Elliott, female    DOB: 04-12-1995  Age: 22 y.o. MRN: [id number]  CC: Follow-up (STD testing consult/lexapro consult. IUD checked--make sure it is in place??)  Vaginal Discharge  The patient's primary symptoms include a genital odor and vaginal discharge. The patient's pertinent negatives include no genital itching, genital lesions, genital rash, missed menses, pelvic pain or vaginal bleeding. This is a new problem. The current episode started in the past 7 days. The problem occurs constantly. The problem has been unchanged. The patient is experiencing no pain. She is not pregnant. Pertinent negatives include no anorexia, back pain, chills, constipation, diarrhea, discolored urine, dysuria, fever, flank pain, joint pain, joint swelling, nausea, painful intercourse, rash, sore throat, urgency or vomiting. The vaginal discharge was thick and yellow. There has been no bleeding. She has not been passing clots. She has not been passing tissue. The symptoms are aggravated by intercourse. She has tried nothing for the symptoms. She is sexually active with multiple partners. It is unknown whether or not her partner has an STD. She uses condoms for contraception. Her past medical history is significant for an STD and vaginosis. There is no history of PID.  intercourse with , impaired condom integrity  Anxiety and Depression: No change in mood with lexapro since last office visit. Has counseling session tomorrow. Report 2 incidents of emotions outburst. Feel ashame that her friends had to see her loose control. Reports increase anxiety leads to lack of interest to socialize and/or complete school work.  Reviewed past Medical, Social and Family history today.  Outpatient Medications Prior to Visit  Medication Sig Dispense Refill  . cetirizine (ZYRTEC) 10 MG tablet Take 1 tablet (10 mg total) by mouth daily. 30 tablet 0  . escitalopram (LEXAPRO) 10 MG tablet Take 1 tablet  (10 mg total) by mouth daily. 90 tablet 1  . fluticasone (FLONASE) 50 MCG/ACT nasal spray Place 2 sprays into both nostrils daily. 16 g 0  . levonorgestrel (MIRENA, 52 MG,) 20 MCG/24HR IUD 1 Intra Uterine Device (1 each total) by Intrauterine route once for 1 dose. Inserted 12/04/2017 by [facility] 1 each 0  . sodium chloride (OCEAN) 0.65 % SOLN nasal spray Place 1 spray into both nostrils as needed for congestion. 15 mL 0   No facility-administered medications prior to visit.     ROS See HPI  Objective:  BP 112/70   Pulse 82   Temp 97.6 F (36.4 C) (Oral)   Ht 5\' 10"  (1.778 m)   Wt 163 lb (73.9 kg)   SpO2 97%   BMI 23.39 kg/m   BP Readings from Last 3 Encounters:  12/30/17 112/70  12/16/17 112/70  11/27/17 120/72    Wt Readings from Last 3 Encounters:  12/30/17 163 lb (73.9 kg)  12/16/17 166 lb (75.3 kg)  11/27/17 162 lb (73.5 kg)    Physical Exam  Genitourinary: Rectal exam shows no external hemorrhoid. Pelvic exam was performed with patient supine. Cervix exhibits discharge. Cervix exhibits no motion tenderness and no friability. Right adnexum displays no tenderness and no fullness. Left adnexum displays no tenderness and no fullness. There is erythema in the vagina. No tenderness in the vagina. Vaginal discharge found.  Genitourinary Comments: Chaperone present.  IUD string present in cervical os.  Vitals reviewed.   Lab Results  Component Value Date   WBC 5.4 11/27/2017   HGB 13.1 11/27/2017   HCT 40.5 11/27/2017   PLT 206.0 11/27/2017   GLUCOSE  83 11/27/2017   ALT 13 11/27/2017   AST 18 11/27/2017   NA 144 11/27/2017   K 4.1 11/27/2017   CL 105 11/27/2017   CREATININE 0.82 11/27/2017   BUN 11 11/27/2017   CO2 29 11/27/2017   TSH 1.15 11/27/2017    Assessment & Plan:   Megan Elliott was seen today for follow-up.  Diagnoses and all orders for this visit:  Chlamydia -     Cervicovaginal ancillary only( [place]) -     metroNIDAZOLE (FLAGYL)  500 MG tablet; Take 1 tablet (500 mg total) by mouth 2 (two) times daily. -     azithromycin (ZITHROMAX) 500 MG tablet; Take 2 tablets (1,000 mg total) by mouth once for 1 dose. -     Urine cytology ancillary only([place]); Future  Dysthymia -     busPIRone (BUSPAR) 7.5 MG tablet; Take 1 tablet (7.5 mg total) by mouth 3 (three) times daily as needed.  High risk heterosexual behavior -     Cervicovaginal ancillary only( [place])   I am having Megan Elliott start on busPIRone, metroNIDAZOLE, and azithromycin. I am also having her maintain her cetirizine, fluticasone, sodium chloride, escitalopram, and levonorgestrel.  Meds ordered this encounter  Medications  . busPIRone (BUSPAR) 7.5 MG tablet    Sig: Take 1 tablet (7.5 mg total) by mouth 3 (three) times daily as needed.    Dispense:  30 tablet    Refill:  1    Order Specific Question:   Supervising Provider    Answer:   MATTHEWS, CODY [[id number]]  . metroNIDAZOLE (FLAGYL) 500 MG tablet    Sig: Take 1 tablet (500 mg total) by mouth 2 (two) times daily.    Dispense:  10 tablet    Refill:  0    Order Specific Question:   Supervising Provider    Answer:   MATTHEWS, CODY [[id number]]  . azithromycin (ZITHROMAX) 500 MG tablet    Sig: Take 2 tablets (1,000 mg total) by mouth once for 1 dose.    Dispense:  2 tablet    Refill:  0    Order Specific Question:   Supervising Provider    Answer:   MATTHEWS, CODY [[id number]]    Problem List Items Addressed This Visit      Other   Depression   Relevant Medications   busPIRone (BUSPAR) 7.5 MG tablet   High-risk sexual behavior   Relevant Orders   Cervicovaginal ancillary only( [place]) (Completed)    Other Visit Diagnoses    Chlamydia    -  Primary   Relevant Medications   metroNIDAZOLE (FLAGYL) 500 MG tablet   azithromycin (ZITHROMAX) 500 MG tablet   Other Relevant Orders   Cervicovaginal ancillary only( [place]) (Completed)   Urine cytology ancillary only([place])        Follow-up: Return in about 1 month (around 02/02/2018) for anxiety (30mins).  Alysia Pennaharlotte Matson Welch, NP

## 2017-12-31 LAB — CERVICOVAGINAL ANCILLARY ONLY
Bacterial vaginitis: NEGATIVE
Candida vaginitis: NEGATIVE
Chlamydia: POSITIVE — AB
Neisseria Gonorrhea: NEGATIVE
Trichomonas: NEGATIVE

## 2018-01-01 MED ORDER — AZITHROMYCIN 500 MG PO TABS: 1000.0000 mg | ORAL_TABLET | Freq: Once | ORAL | 0 refills | Status: AC

## 2018-01-01 NOTE — Addendum Note (Signed)
Addended by: Alysia PennaNCHE, Tamara Monteith L on: 01/01/2018 12:28 PM   Modules accepted: Orders

## 2018-02-10 ENCOUNTER — Other Ambulatory Visit (HOSPITAL_COMMUNITY)
Admission: RE | Admit: 2018-02-10 | Discharge: 2018-02-10 | Disposition: A | Discharge status: Home / Self Care | Payer: Managed Care, Other (non HMO) | Source: Ambulatory Visit | Attending: Nurse Practitioner | Admitting: Nurse Practitioner

## 2018-02-10 ENCOUNTER — Encounter: Payer: Self-pay | Admitting: Nurse Practitioner

## 2018-02-10 ENCOUNTER — Ambulatory Visit (INDEPENDENT_AMBULATORY_CARE_PROVIDER_SITE_OTHER): Payer: Managed Care, Other (non HMO) | Admitting: Nurse Practitioner

## 2018-02-10 VITALS — BP 102/82 | HR 76 | Temp 98.2°F | Ht 70.0 in | Wt 158.6 lb

## 2018-02-10 DIAGNOSIS — A749 Chlamydial infection, unspecified: Secondary | ICD-10-CM | POA: Insufficient documentation

## 2018-02-10 DIAGNOSIS — F341 Dysthymic disorder: Secondary | ICD-10-CM | POA: Diagnosis not present

## 2018-02-10 MED ORDER — VENLAFAXINE HCL ER 37.5 MG PO CP24: 37.5000 mg | ORAL_CAPSULE | Freq: Every day | ORAL | 2 refills | Status: DC

## 2018-02-10 NOTE — Patient Instructions (Signed)
Stop lexapro.  Start effexor tomorrow with breakfast.  Continue buspar as needed.  Venlafaxine tablets What is this medicine? VENLAFAXINE (VEN la fax een) is used to treat depression, anxiety and panic disorder. This medicine may be used for other purposes; ask your health care provider or pharmacist if you have questions. COMMON BRAND NAME(S): Effexor What should I tell my health care provider before I take this medicine? They need to know if you have any of these conditions: -bleeding disorders -glaucoma -heart disease -high blood pressure -high cholesterol -kidney disease -liver disease -low levels of sodium in the blood -mania or bipolar disorder -seizures -suicidal thoughts, plans, or attempt; a previous suicide attempt by you or a family -take medicines that treat or prevent blood clots -thyroid disease -an unusual or allergic reaction to venlafaxine, desvenlafaxine, other medicines, foods, dyes, or preservatives -pregnant or trying to get pregnant -breast-feeding How should I use this medicine? Take this medicine by mouth with a glass of water. Follow the directions on the prescription label. Take it with food. Take your medicine at regular intervals. Do not take your medicine more often than directed. Do not stop taking this medicine suddenly except upon the advice of your doctor. Stopping this medicine too quickly may cause serious side effects or your condition may worsen. A special MedGuide will be given to you by the pharmacist with each prescription and refill. Be sure to read this information carefully each time. Talk to your pediatrician regarding the use of this medicine in children. Special care may be needed. Overdosage: If you think you have taken too much of this medicine contact a poison control center or emergency room at once. NOTE: This medicine is only for you. Do not share this medicine with others. What if I miss a dose? If you miss a dose, take it as  soon as you can. If it is almost time for your next dose, take only that dose. Do not take double or extra doses. What may interact with this medicine? Do not take this medicine with any of the following medications: -certain medicines for fungal infections like fluconazole, itraconazole, ketoconazole, posaconazole, voriconazole -cisapride -desvenlafaxine -dofetilide -dronedarone -duloxetine -levomilnacipran -linezolid -MAOIs like Carbex, Eldepryl, Marplan, Nardil, and Parnate -methylene blue (injected into a vein) -milnacipran -pimozide -thioridazine -ziprasidone This medicine may also interact with the following medications: -amphetamines -aspirin and aspirin-like medicines -certain medicines for depression, anxiety, or psychotic disturbances -certain medicines for migraine headaches like almotriptan, eletriptan, frovatriptan, naratriptan, rizatriptan, sumatriptan, zolmitriptan -certain medicines for sleep -certain medicines that treat or prevent blood clots like dalteparin, enoxaparin, warfarin -cimetidine -clozapine -diuretics -fentanyl -furazolidone -indinavir -isoniazid -lithium -metoprolol -NSAIDS, medicines for pain and inflammation, like ibuprofen or naproxen -other medicines that prolong the QT interval (cause an abnormal heart rhythm) -procarbazine -rasagiline -supplements like St. John's wort, kava kava, valerian -tramadol -tryptophan This list may not describe all possible interactions. Give your health care provider a list of all the medicines, herbs, non-prescription drugs, or dietary supplements you use. Also tell them if you smoke, drink alcohol, or use illegal drugs. Some items may interact with your medicine. What should I watch for while using this medicine? Tell your doctor if your symptoms do not get better or if they get worse. Visit your doctor or health care professional for regular checks on your progress. Because it may take several weeks to see  the full effects of this medicine, it is important to continue your treatment as prescribed by your doctor. Patients and their  families should watch out for new or worsening thoughts of suicide or depression. Also watch out for sudden changes in feelings such as feeling anxious, agitated, panicky, irritable, hostile, aggressive, impulsive, severely restless, overly excited and hyperactive, or not being able to sleep. If this happens, especially at the beginning of treatment or after a change in dose, call your health care professional. This medicine can cause an increase in blood pressure. Check with your doctor for instructions on monitoring your blood pressure while taking this medicine. You may get drowsy or dizzy. Do not drive, use machinery, or do anything that needs mental alertness until you know how this medicine affects you. Do not stand or sit up quickly, especially if you are an older patient. This reduces the risk of dizzy or fainting spells. Alcohol may interfere with the effect of this medicine. Avoid alcoholic drinks. Your mouth may get dry. Chewing sugarless gum, sucking hard candy and drinking plenty of water will help. Contact your doctor if the problem does not go away or is severe. What side effects may I notice from receiving this medicine? Side effects that you should report to your doctor or health care professional as soon as possible: -allergic reactions like skin rash, itching or hives, swelling of the face, lips, or tongue -anxious -breathing problems -confusion -changes in vision -chest pain -confusion -elevated mood, decreased need for sleep, racing thoughts, impulsive behavior -eye pain -fast, irregular heartbeat -feeling faint or lightheaded, falls -feeling agitated, angry, or irritable -hallucination, loss of contact with reality -high blood pressure -loss of balance or coordination -palpitations -redness, blistering, peeling or loosening of the skin, including  inside the mouth -restlessness, pacing, inability to keep still -seizures -stiff muscles -suicidal thoughts or other mood changes -trouble passing urine or change in the amount of urine -trouble sleeping -unusual bleeding or bruising -unusually weak or tired -vomiting Side effects that usually do not require medical attention (report to your doctor or health care professional if they continue or are bothersome): -change in sex drive or performance -change in appetite or weight -constipation -dizziness -dry mouth -headache -increased sweating -nausea -tired This list may not describe all possible side effects. Call your doctor for medical advice about side effects. You may report side effects to FDA at 1-800-FDA-1088. Where should I keep my medicine? Keep out of the reach of children. Store at a controlled temperature between 20 and 25 degrees C (68 and 77 degrees F), in a dry place. Throw away any unused medicine after the expiration date. NOTE: This sheet is a summary. It may not cover all possible information. If you have questions about this medicine, talk to your doctor, pharmacist, or health care provider.  2019 Elsevier/Gold Standard (2015-06-15 18:42:26)

## 2018-02-10 NOTE — Progress Notes (Signed)
Subjective:  Patient ID: Megan Elliott, female    DOB: 11/15/1995  Age: 23 y.o. MRN: [id number]  CC: Follow-up (2 mo f/u depression/ feels like lexapro making her worse/ no improvement)   HPI Anxiety: Some improve with use of buspar once a day as needed. Does not think lexapro has made a difference in her mood. Use of lexapro for last 10/2017. She also reports vivid dreams since starting lexapro, getting worse. No SI or HI.  Chlamydia infection: Completed treatment but did not repeat labs to ensure proper treatment. Continues to be sexually active with same partner.  Reviewed past Medical, Social and Family history today.  Outpatient Medications Prior to Visit  Medication Sig Dispense Refill  . busPIRone (BUSPAR) 7.5 MG tablet Take 1 tablet (7.5 mg total) by mouth 3 (three) times daily as needed. 30 tablet 1  . cetirizine (ZYRTEC) 10 MG tablet Take 1 tablet (10 mg total) by mouth daily. 30 tablet 0  . levonorgestrel (MIRENA, 52 MG,) 20 MCG/24HR IUD 1 Intra Uterine Device (1 each total) by Intrauterine route once for 1 dose. Inserted 12/04/2017 by [facility] 1 each 0  . levonorgestrel (MIRENA, 52 MG,) 20 MCG/24HR IUD Mirena 20 mcg/24 hours (5 yrs) 52 mg intrauterine device  Take by intrauterine route.    . metroNIDAZOLE (FLAGYL) 500 MG tablet Take 1 tablet (500 mg total) by mouth 2 (two) times daily. 10 tablet 0  . sodium chloride (OCEAN) 0.65 % SOLN nasal spray Place 1 spray into both nostrils as needed for congestion. 15 mL 0  . escitalopram (LEXAPRO) 10 MG tablet Take 1 tablet (10 mg total) by mouth daily. 90 tablet 1  . fluticasone (FLONASE) 50 MCG/ACT nasal spray Place 2 sprays into both nostrils daily. (Patient not taking: Reported on 02/10/2018) 16 g 0   No facility-administered medications prior to visit.     ROS See HPI  Objective:  BP 102/82   Pulse 76   Temp 98.2 F (36.8 C) (Oral)   Ht 5\' 10"  (1.778 m)   Wt 158 lb 9.6 oz (71.9 kg)   SpO2 97%   BMI  22.76 kg/m   BP Readings from Last 3 Encounters:  02/10/18 102/82  12/30/17 112/70  12/16/17 112/70    Wt Readings from Last 3 Encounters:  02/10/18 158 lb 9.6 oz (71.9 kg)  12/30/17 163 lb (73.9 kg)  12/16/17 166 lb (75.3 kg)    Physical Exam Psychiatric:        Attention and Perception: Attention normal.        Mood and Affect: Affect is blunt.        Speech: Speech normal.        Behavior: Behavior is cooperative.        Thought Content: Thought content is not delusional. Thought content does not include homicidal or suicidal ideation. Thought content does not include homicidal or suicidal plan.        Cognition and Memory: Cognition normal.     Lab Results  Component Value Date   WBC 5.4 11/27/2017   HGB 13.1 11/27/2017   HCT 40.5 11/27/2017   PLT 206.0 11/27/2017   GLUCOSE 83 11/27/2017   ALT 13 11/27/2017   AST 18 11/27/2017   NA 144 11/27/2017   K 4.1 11/27/2017   CL 105 11/27/2017   CREATININE 0.82 11/27/2017   BUN 11 11/27/2017   CO2 29 11/27/2017   TSH 1.15 11/27/2017    Assessment & Plan:   [name]  was seen today for follow-up.  Diagnoses and all orders for this visit:  Chlamydia -     Urine cytology ancillary only([place])  Dysthymia -     venlafaxine XR (EFFEXOR XR) 37.5 MG 24 hr capsule; Take 1 capsule (37.5 mg total) by mouth daily with breakfast.   I have discontinued [name]'s escitalopram. I am also having her start on venlafaxine XR. Additionally, I am having her maintain her cetirizine, fluticasone, sodium chloride, levonorgestrel, busPIRone, metroNIDAZOLE, and levonorgestrel.  Meds ordered this encounter  Medications  . venlafaxine XR (EFFEXOR XR) 37.5 MG 24 hr capsule    Sig: Take 1 capsule (37.5 mg total) by mouth daily with breakfast.    Dispense:  30 capsule    Refill:  2    Order Specific Question:   Supervising Provider    Answer:   MATTHEWS, CODY [[id number]]    Problem List Items Addressed This Visit      Other    Depression   Relevant Medications   venlafaxine XR (EFFEXOR XR) 37.5 MG 24 hr capsule    Other Visit Diagnoses    Chlamydia    -  Primary   Relevant Orders   Urine cytology ancillary only([place])       Follow-up: Return in about 2 months (around 04/11/2018) for anxiety.  Megan Penna, NP

## 2018-02-10 NOTE — Assessment & Plan Note (Signed)
Vivid dreams with lexapro. Use of buspar for panic attacks

## 2018-02-11 LAB — URINE CYTOLOGY ANCILLARY ONLY
Chlamydia: NEGATIVE
Neisseria Gonorrhea: NEGATIVE
Trichomonas: NEGATIVE

## 2018-03-10 ENCOUNTER — Encounter: Payer: Self-pay | Admitting: Nurse Practitioner

## 2018-03-23 ENCOUNTER — Encounter: Payer: Self-pay | Admitting: Nurse Practitioner

## 2018-03-25 ENCOUNTER — Ambulatory Visit (INDEPENDENT_AMBULATORY_CARE_PROVIDER_SITE_OTHER): Payer: Managed Care, Other (non HMO) | Admitting: Nurse Practitioner

## 2018-03-25 ENCOUNTER — Other Ambulatory Visit (HOSPITAL_COMMUNITY)
Admission: RE | Admit: 2018-03-25 | Discharge: 2018-03-25 | Disposition: A | Discharge status: Home / Self Care | Payer: Managed Care, Other (non HMO) | Source: Ambulatory Visit | Attending: Nurse Practitioner | Admitting: Nurse Practitioner

## 2018-03-25 ENCOUNTER — Encounter: Payer: Self-pay | Admitting: Nurse Practitioner

## 2018-03-25 ENCOUNTER — Ambulatory Visit: Payer: Managed Care, Other (non HMO) | Admitting: Nurse Practitioner

## 2018-03-25 VITALS — BP 120/72 | HR 80 | Temp 98.6°F | Ht 70.0 in | Wt 161.0 lb

## 2018-03-25 DIAGNOSIS — K5900 Constipation, unspecified: Secondary | ICD-10-CM

## 2018-03-25 DIAGNOSIS — B373 Candidiasis of vulva and vagina: Secondary | ICD-10-CM

## 2018-03-25 DIAGNOSIS — F341 Dysthymic disorder: Secondary | ICD-10-CM

## 2018-03-25 DIAGNOSIS — N898 Other specified noninflammatory disorders of vagina: Secondary | ICD-10-CM | POA: Insufficient documentation

## 2018-03-25 DIAGNOSIS — K602 Anal fissure, unspecified: Secondary | ICD-10-CM | POA: Diagnosis not present

## 2018-03-25 DIAGNOSIS — N930 Postcoital and contact bleeding: Secondary | ICD-10-CM

## 2018-03-25 DIAGNOSIS — B3731 Acute candidiasis of vulva and vagina: Secondary | ICD-10-CM

## 2018-03-25 DIAGNOSIS — N939 Abnormal uterine and vaginal bleeding, unspecified: Secondary | ICD-10-CM | POA: Diagnosis present

## 2018-03-25 MED ORDER — BUSPIRONE HCL 7.5 MG PO TABS: 7.5000 mg | ORAL_TABLET | Freq: Three times a day (TID) | ORAL | 5 refills | Status: DC | PRN

## 2018-03-25 MED ORDER — POLYETHYLENE GLYCOL 3350 17 GM/SCOOP PO POWD: 17.0000 g | Freq: Every day | ORAL | 1 refills | Status: DC

## 2018-03-25 MED ORDER — VENLAFAXINE HCL ER 37.5 MG PO CP24: 37.5000 mg | ORAL_CAPSULE | Freq: Every day | ORAL | 1 refills | Status: DC

## 2018-03-25 MED ORDER — HYDROCORTISONE ACETATE 25 MG RE SUPP: 25.0000 mg | Freq: Every day | RECTAL | 0 refills | Status: DC

## 2018-03-25 NOTE — Progress Notes (Addendum)
Subjective:  Patient ID: Megan Elliott, female    DOB: 1995/09/07  Age: 23 y.o. MRN: [id number]  CC: Vaginal Bleeding (bleeding after sex and bleeding when wipe after BM/med refill)  HPI Megan Elliott presents with vaginal bleed post intercourse, describes as light stain on underwear, last for 1-2days the resolves. Reports regular menstrual cycle. IUD present. No pain with intercourse. Condom. New partner.  Sje also reports intermittent blood per rectum, associated with straining during BMTakes part in rectal sex as well. Constipation improved with miralax. Blood on tissue when wipes only. No pain with BM, no pelvic pain.  Improved mood with buspar and effexor Depression screen [facility] 2/9 03/25/2018 11/13/2017 11/13/2017  Decreased Interest Down, Depressed, Hopeless PHQ - 2 Score Altered sleeping 0 1 -  Tired, decreased energy 2 2 -  Change in appetite 0 2 -  Feeling bad or failure about yourself  1 1 -  Trouble concentrating 1 3 -  Moving slowly or fidgety/restless 0 1 -  Suicidal thoughts 0 0 -  PHQ-9 Score 6 13 -   GAD 7 : Generalized Anxiety Score 03/25/2018 11/13/2017  Nervous, Anxious, on Edge 1 3  Control/stop worrying 2 3  Worry too much - different things 1 3  Trouble relaxing 1 3  Restless 1 2  Easily annoyed or irritable 1 3  Afraid - awful might happen 0 2  Total GAD 7 Score 7 19   Reviewed past Medical, Social and Family history today.  Outpatient Medications Prior to Visit  Medication Sig Dispense Refill  . cetirizine (ZYRTEC) 10 MG tablet Take 1 tablet (10 mg total) by mouth daily. 30 tablet 0  . levonorgestrel (MIRENA, 52 MG,) 20 MCG/24HR IUD 1 Intra Uterine Device (1 each total) by Intrauterine route once for 1 dose. Inserted 12/04/2017 by [facility] 1 each 0  . levonorgestrel (MIRENA, 52 MG,) 20 MCG/24HR IUD Mirena 20 mcg/24 hours (5 yrs) 52 mg intrauterine device  Take by intrauterine route.    . busPIRone (BUSPAR) 7.5 MG tablet  Take 1 tablet (7.5 mg total) by mouth 3 (three) times daily as needed. 30 tablet 1  . metroNIDAZOLE (FLAGYL) 500 MG tablet Take 1 tablet (500 mg total) by mouth 2 (two) times daily. 10 tablet 0  . sodium chloride (OCEAN) 0.65 % SOLN nasal spray Place 1 spray into both nostrils as needed for congestion. 15 mL 0  . venlafaxine XR (EFFEXOR XR) 37.5 MG 24 hr capsule Take 1 capsule (37.5 mg total) by mouth daily with breakfast. 30 capsule 2  . fluticasone (FLONASE) 50 MCG/ACT nasal spray Place 2 sprays into both nostrils daily. (Patient not taking: Reported on 03/25/2018) 16 g 0   No facility-administered medications prior to visit.     ROS See HPI  Objective:  BP 120/72   Pulse 80   Temp 98.6 F (37 C) (Oral)   Ht  (1.778 m)   Wt 161 lb (73 kg)   SpO2 98%   BMI 23.10 kg/m   BP Readings from Last 3 Encounters:  03/25/18 120/72  02/10/18 102/82  12/30/17 112/70    Wt Readings from Last 3 Encounters:  03/25/18 161 lb (73 kg)  02/10/18 158 lb 9.6 oz (71.9 kg)  12/30/17 163 lb (73.9 kg)    Physical Exam Vitals signs reviewed. Exam conducted with a chaperone present.  Cardiovascular:     Rate and Rhythm: Normal rate.  Pulmonary:     Effort: Pulmonary effort is normal.  Genitourinary:    Pubic Area: No rash.      Labia:        Right: No rash or tenderness.        Left: No rash or tenderness.      Vagina: Vaginal discharge present.     Cervix: Discharge present. No cervical motion tenderness, friability or erythema.     Adnexa: Right adnexa normal and left adnexa normal.     Rectum: Guaiac result negative. Anal fissure and external hemorrhoid present. No tenderness.  Lymphadenopathy:     Lower Body: No right inguinal adenopathy. No left inguinal adenopathy.  Skin:    Findings: No rash.  Neurological:     Mental Status: She is alert and oriented to person, place, and time.  Psychiatric:        Mood and Affect: Mood normal.        Behavior: Behavior normal.      Lab Results  Component Value Date   WBC 5.4 11/27/2017   HGB 13.1 11/27/2017   HCT 40.5 11/27/2017   PLT 206.0 11/27/2017   GLUCOSE 83 11/27/2017   ALT 13 11/27/2017   AST 18 11/27/2017   NA 144 11/27/2017   K 4.1 11/27/2017   CL 105 11/27/2017   CREATININE 0.82 11/27/2017   BUN 11 11/27/2017   CO2 29 11/27/2017   TSH 1.15 11/27/2017    Assessment & Plan:   Megan Elliott was seen today for vaginal bleeding.  Diagnoses and all orders for this visit:  Postcoital bleeding -     Cervicovaginal ancillary only( [place])  Candida vaginitis -     Cervicovaginal ancillary only( [place]) -     fluconazole (DIFLUCAN) 150 MG tablet; Take 1 tablet (150 mg total) by mouth daily. Take second tab 3days apart from first tab  Anal fissure -     hydrocortisone (ANUSOL-HC) 25 MG suppository; Place 1 suppository (25 mg total) rectally at bedtime.  Constipation, unspecified constipation type -     polyethylene glycol powder (GLYCOLAX/MIRALAX) powder; Take 17 g by mouth daily.  Dysthymia -     venlafaxine XR (EFFEXOR XR) 37.5 MG 24 hr capsule; Take 1 capsule (37.5 mg total) by mouth daily with breakfast. -     busPIRone (BUSPAR) 7.5 MG tablet; Take 1 tablet (7.5 mg total) by mouth 3 (three) times daily as needed.   I have discontinued Megan Elliott's fluticasone, sodium chloride, and metroNIDAZOLE. I am also having her start on hydrocortisone, polyethylene glycol powder, and fluconazole. Additionally, I am having her maintain her cetirizine, levonorgestrel, levonorgestrel, venlafaxine XR, and busPIRone.  Meds ordered this encounter  Medications  . hydrocortisone (ANUSOL-HC) 25 MG suppository    Sig: Place 1 suppository (25 mg total) rectally at bedtime.    Dispense:  12 suppository    Refill:  0    Order Specific Question:   Supervising Provider    Answer:   Dianne Dun [[id number]]  . polyethylene glycol powder (GLYCOLAX/MIRALAX) powder    Sig: Take 17 g by mouth daily.     Dispense:  507 g    Refill:  1    Order Specific Question:   Supervising Provider    Answer:   Dianne Dun [[id number]]  . venlafaxine XR (EFFEXOR XR) 37.5 MG 24 hr capsule    Sig: Take 1 capsule (37.5 mg total) by mouth daily with breakfast.    Dispense:  90 capsule  Refill:  1    Order Specific Question:   Supervising Provider    Answer:   Dianne Dun [[id number]]  . busPIRone (BUSPAR) 7.5 MG tablet    Sig: Take 1 tablet (7.5 mg total) by mouth 3 (three) times daily as needed.    Dispense:  90 tablet    Refill:  5    Order Specific Question:   Supervising Provider    Answer:   Dianne Dun [[id number]]  . fluconazole (DIFLUCAN) 150 MG tablet    Sig: Take 1 tablet (150 mg total) by mouth daily. Take second tab 3days apart from first tab    Dispense:  2 tablet    Refill:  0    Order Specific Question:   Supervising Provider    Answer:   MATTHEWS, CODY [[id number]]    Problem List Items Addressed This Visit      Other   Constipation   Relevant Medications   polyethylene glycol powder (GLYCOLAX/MIRALAX) powder   Depression   Relevant Medications   venlafaxine XR (EFFEXOR XR) 37.5 MG 24 hr capsule   busPIRone (BUSPAR) 7.5 MG tablet    Other Visit Diagnoses    Postcoital bleeding    -  Primary   Relevant Orders   Cervicovaginal ancillary only( [place]) (Completed)   Candida vaginitis       Relevant Medications   fluconazole (DIFLUCAN) 150 MG tablet   Other Relevant Orders   Cervicovaginal ancillary only( [place]) (Completed)   Anal fissure       Relevant Medications   hydrocortisone (ANUSOL-HC) 25 MG suppository       Follow-up: Return if symptoms worsen or fail to improve.  [name], NP

## 2018-03-25 NOTE — Patient Instructions (Addendum)
Stop rectal sex. Advise partner to be more gentle during intercourse  How to Take a Sitz Bath A sitz bath is a warm water bath that may be used to care for your rectum, genital area, or the area between your rectum and genitals (perineum). For a sitz bath, the water only comes up to your hips and covers your buttocks. A sitz bath may done at home in a bathtub or with a portable sitz bath that fits over the toilet. Your health care provider may recommend a sitz bath to help:  Relieve pain and discomfort after delivering a baby.  Relieve pain and itching from hemorrhoids or anal fissures.  Relieve pain after certain surgeries.  Relax muscles that are sore or tight. How to take a sitz bath Take 3-4 sitz baths a day, or as many as told by your health care provider. Bathtub sitz bath To take a sitz bath in a bathtub: 1. Partially fill a bathtub with warm water. The water should be deep enough to cover your hips and buttocks when you are sitting in the tub. 2. If your health care provider told you to put medicine in the water, follow his or her instructions. 3. Sit in the water. 4. Open the tub drain a little, and leave it open during your bath. 5. Turn on the warm water again, enough to replace the water that is draining out. Keep the water running throughout your bath. This helps keep the water at the right level and the right temperature. 6. Soak in the water for 15-20 minutes, or as long as told by your health care provider. 7. When you are done, be careful when you stand up. You may feel dizzy. 8. After the sitz bath, pat yourself dry. Do not rub your skin to dry it.  Over-the-toilet sitz bath To take a sitz bath with an over-the-toilet basin: 1. Follow the manufacturer's instructions. 2. Fill the basin with warm water. 3. If your health care provider told you to put medicine in the water, follow his or her instructions. 4. Sit on the seat. Make sure the water covers your buttocks and  perineum. 5. Soak in the water for 15-20 minutes, or as long as told by your health care provider. 6. After the sitz bath, pat yourself dry. Do not rub your skin to dry it. 7. Clean and dry the basin between uses. 8. Discard the basin if it cracks, or according to the manufacturer's instructions. Contact a health care provider if:  Your symptoms get worse. Do not continue with sitz baths if your symptoms get worse.  You have new symptoms. If this happens, do not continue with sitz baths until you talk with your health care provider. Summary  A sitz bath is a warm water bath in which the water only comes up to your hips and covers your buttocks.  A sitz bath may help relieve itching, relieve pain, and relax muscles that are sore or tight in the lower part of your body, including your genital area.  Take 3-4 sitz baths a day, or as many as told by your health care provider. Soak in the water for 15-20 minutes.  Do not continue with sitz baths if your symptoms get worse. This information is not intended to replace advice given to you by your health care provider. Make sure you discuss any questions you have with your health care provider. Document Released: 10/07/2003 Document Revised: 01/16/2017 Document Reviewed: 01/16/2017 Elsevier Interactive Patient Education  2019 Else[employer] Inc[employer]   Anal Fissure, Adult  An anal fissure is a small tear or crack in the tissue around the opening of the butt (anus). Bleeding from the tear or crack usually stops on its own within a few minutes. The bleeding may happen every time you poop (have a bowel movement) until the tear or crack heals. What are the causes? This condition is usually caused by passing a large or hard poop (stool). Other causes include:  Trouble pooping (constipation).  Passing watery poop (diarrhea).  Inflammatory bowel disease (Crohn's disease or ulcerative colitis).  Childbirth.  Infections.  Anal sex. What are the signs or  symptoms? Symptoms of this condition include:  Bleeding from the butt.  Small amounts of blood on your poop. The blood coats the outside of the poop. It is not mixed with the poop.  Small amounts of blood on the toilet paper or in the toilet after you poop.  Pain when passing poop.  Itching or irritation around the opening of the butt. How is this diagnosed? This condition may be diagnosed based on a physical exam. Your doctor may:  Check your butt. A tear can often be seen by checking the area with care.  Check your butt using a short tube (anoscope). The light in the tube will show any problems in your butt. How is this treated? Treatment for this condition may include:  Treating problems that make it hard for you to pass poop. You may be told to: ? Eat more fiber. ? Drink more fluid. ? Take fiber supplements. ? Take medicines that make poop soft.  Taking sitz baths. This may help to heal the tear.  Using creams and ointments. If your condition gets worse, other treatments may be needed such as:  A shot near the tear or crack (botulinum injection).  Surgery to repair the tear or crack. Follow these instructions at home: Eating and drinking   Avoid bananas and dairy products. These foods can make it hard to poop.  Drink enough fluid to keep your pee (urine) pale yellow.  Eat foods that have a lot of fiber in them, such as: ? Beans. ? Whole grains. ? Fresh fruits. ? Fresh vegetables. General instructions   Take over-the-counter and prescription medicines only as told by your doctor.  Use creams or ointments only as told by your doctor.  Keep the butt area as clean and dry as you can.  Take a warm water bath (sitz bath) as told by your doctor. Do not use soap.  Keep all follow-up visits as told by your doctor. This is important. Contact a doctor if:  You have more bleeding.  You have a fever.  You have watery poop that is mixed with blood.  You have  pain.  Your problem gets worse, not better. Summary  An anal fissure is a small tear or crack in the skin around the opening of the butt (anus).  This condition is usually caused by passing a large or hard poop (stool).  Treatment includes treating the problems that make it hard for you to pass poop.  Follow your doctor's instructions about caring for your condition at home.  Keep all follow-up visits as told by your doctor. This is important. This information is not intended to replace advice given to you by your health care provider. Make sure you discuss any questions you have with your health care provider. Document Released: 09/12/2010 Document Revised: 06/26/2017 Document Reviewed: 06/26/2017 Elsevier Interactive  Patient Education  2019 [employer].

## 2018-03-26 ENCOUNTER — Encounter: Payer: Self-pay | Admitting: Nurse Practitioner

## 2018-03-26 DIAGNOSIS — K5900 Constipation, unspecified: Secondary | ICD-10-CM | POA: Insufficient documentation

## 2018-03-27 LAB — CERVICOVAGINAL ANCILLARY ONLY
Bacterial vaginitis: NEGATIVE
Candida vaginitis: POSITIVE — AB
Chlamydia: NEGATIVE
NEISSERIA GONORRHEA: NEGATIVE
Trichomonas: NEGATIVE

## 2018-03-27 MED ORDER — FLUCONAZOLE 150 MG PO TABS: 150.0000 mg | ORAL_TABLET | Freq: Every day | ORAL | 0 refills | Status: DC

## 2018-03-27 NOTE — Addendum Note (Signed)
Addended by: Michaela Corner on: 03/27/2018 04:58 PM   Modules accepted: Orders

## 2018-04-01 ENCOUNTER — Encounter: Payer: Self-pay | Admitting: Nurse Practitioner

## 2018-04-03 ENCOUNTER — Ambulatory Visit (INDEPENDENT_AMBULATORY_CARE_PROVIDER_SITE_OTHER): Payer: Managed Care, Other (non HMO) | Admitting: Nurse Practitioner

## 2018-04-03 ENCOUNTER — Encounter: Payer: Self-pay | Admitting: Nurse Practitioner

## 2018-04-03 VITALS — BP 118/66 | HR 89 | Temp 98.6°F | Ht 70.0 in | Wt 160.8 lb

## 2018-04-03 DIAGNOSIS — K5900 Constipation, unspecified: Secondary | ICD-10-CM | POA: Diagnosis not present

## 2018-04-03 DIAGNOSIS — R1030 Lower abdominal pain, unspecified: Secondary | ICD-10-CM | POA: Diagnosis not present

## 2018-04-03 LAB — POCT URINALYSIS DIPSTICK
Bilirubin, UA: NEGATIVE
Blood, UA: NEGATIVE
GLUCOSE UA: NEGATIVE
Ketones, UA: NEGATIVE
Leukocytes, UA: NEGATIVE
Nitrite, UA: NEGATIVE
Protein, UA: NEGATIVE
Spec Grav, UA: 1.025 (ref 1.010–1.025)
Urobilinogen, UA: 0.2 E.U./dL
pH, UA: 6 (ref 5.0–8.0)

## 2018-04-03 MED ORDER — SENNOSIDES-DOCUSATE SODIUM 8.6-50 MG PO TABS: 1.0000 | ORAL_TABLET | Freq: Every day | ORAL | 0 refills | Status: DC

## 2018-04-03 NOTE — Patient Instructions (Addendum)
Let me know is no improvement in ABD pain and nausea by Monday. I think nausea and ABD pain is due to constipation and use of miralax. Normal urinalysis.  Stop miralax, start senna for constipation.  Reschedule upcoming appt to 10months from today.

## 2018-04-03 NOTE — Progress Notes (Signed)
Subjective:  Patient ID: Megan Elliott, female    DOB: 07/23/1995  Age: 23 y.o. MRN: [id number]  CC: Abdominal Pain (nausea, onset 3 days ago/might be from somthing she ate?)  Abdominal Pain  This is a new problem. The current episode started in the past 7 days. The onset quality is sudden. The problem occurs constantly. The problem has been unchanged. The pain is located in the generalized abdominal region. The quality of the pain is aching and dull. The abdominal pain does not radiate. Associated symptoms include constipation and nausea. Pertinent negatives include no anorexia, belching, diarrhea, dysuria, flatus, frequency, melena or vomiting. Nothing aggravates the pain. The pain is relieved by nothing. She has tried nothing for the symptoms.  last BM this AM, had to strain. Started miralax 2days ago.  Reviewed past Medical, Social and Family history today.  Outpatient Medications Prior to Visit  Medication Sig Dispense Refill  . albuterol (PROVENTIL HFA;VENTOLIN HFA) 108 (90 Base) MCG/ACT inhaler INL 2 PFS PO Q 4 TO 6 H PRF CHEST TIGHTNESS OR WHZ    . busPIRone (BUSPAR) 7.5 MG tablet Take 1 tablet (7.5 mg total) by mouth 3 (three) times daily as needed. 90 tablet 5  . cetirizine (ZYRTEC) 10 MG tablet Take 1 tablet (10 mg total) by mouth daily. 30 tablet 0  . hydrocortisone (ANUSOL-HC) 25 MG suppository Place 1 suppository (25 mg total) rectally at bedtime. 12 suppository 0  . levonorgestrel (MIRENA, 52 MG,) 20 MCG/24HR IUD 1 Intra Uterine Device (1 each total) by Intrauterine route once for 1 dose. Inserted 12/04/2017 by [facility] 1 each 0  . venlafaxine XR (EFFEXOR XR) 37.5 MG 24 hr capsule Take 1 capsule (37.5 mg total) by mouth daily with breakfast. 90 capsule 1  . fluconazole (DIFLUCAN) 150 MG tablet Take 1 tablet (150 mg total) by mouth daily. Take second tab 3days apart from first tab 2 tablet 0  . levonorgestrel (MIRENA, 52 MG,) 20 MCG/24HR IUD Mirena 20 mcg/24 hours (5  yrs) 52 mg intrauterine device  Take by intrauterine route.    . polyethylene glycol powder (GLYCOLAX/MIRALAX) powder Take 17 g by mouth daily. 507 g 1   No facility-administered medications prior to visit.     ROS See HPI  Objective:  BP 118/66   Pulse 89   Temp 98.6 F (37 C) (Oral)   Ht 5\' 10"  (1.778 m)   Wt 160 lb 12.8 oz (72.9 kg)   LMP 03/24/2018 (Exact Date)   SpO2 94%   BMI 23.07 kg/m   BP Readings from Last 3 Encounters:  04/03/18 118/66  03/25/18 120/72  02/10/18 102/82    Wt Readings from Last 3 Encounters:  04/03/18 160 lb 12.8 oz (72.9 kg)  03/25/18 161 lb (73 kg)  02/10/18 158 lb 9.6 oz (71.9 kg)    Physical Exam Vitals signs reviewed.  Abdominal:     General: Abdomen is flat. Bowel sounds are normal.     Palpations: Abdomen is soft.     Tenderness: There is generalized abdominal tenderness. There is no right CVA tenderness or left CVA tenderness.  Neurological:     Mental Status: She is alert and oriented to person, place, and time.     Lab Results  Component Value Date   WBC 5.4 11/27/2017   HGB 13.1 11/27/2017   HCT 40.5 11/27/2017   PLT 206.0 11/27/2017   GLUCOSE 83 11/27/2017   ALT 13 11/27/2017   AST 18 11/27/2017   NA  144 11/27/2017   K 4.1 11/27/2017   CL 105 11/27/2017   CREATININE 0.82 11/27/2017   BUN 11 11/27/2017   CO2 29 11/27/2017   TSH 1.15 11/27/2017    Assessment & Plan:   Megan Elliott was seen today for abdominal pain.  Diagnoses and all orders for this visit:  Lower abdominal pain -     POCT urinalysis dipstick  Constipation, unspecified constipation type -     senna-docusate (SENOKOT-S) 8.6-50 MG tablet; Take 1 tablet by mouth at bedtime.   I have discontinued Eathel Kievit's polyethylene glycol powder and fluconazole. I am also having her start on senna-docusate. Additionally, I am having her maintain her cetirizine, levonorgestrel, hydrocortisone, venlafaxine XR, busPIRone, and albuterol.  Meds ordered this  encounter  Medications  . senna-docusate (SENOKOT-S) 8.6-50 MG tablet    Sig: Take 1 tablet by mouth at bedtime.    Dispense:  30 tablet    Refill:  0    Order Specific Question:   Supervising Provider    Answer:   Dianne Dun [[id number]]    Problem List Items Addressed This Visit      Other   Constipation   Relevant Medications   senna-docusate (SENOKOT-S) 8.6-50 MG tablet    Other Visit Diagnoses    Lower abdominal pain    -  Primary   Relevant Orders   POCT urinalysis dipstick (Completed)       Follow-up: Return in about 3 months (around 07/04/2018) for anxiety ( ).  [name], NP

## 2018-04-14 ENCOUNTER — Ambulatory Visit: Payer: Managed Care, Other (non HMO) | Admitting: Nurse Practitioner

## 2018-05-26 ENCOUNTER — Ambulatory Visit (INDEPENDENT_AMBULATORY_CARE_PROVIDER_SITE_OTHER): Payer: Managed Care, Other (non HMO) | Admitting: Nurse Practitioner

## 2018-05-26 ENCOUNTER — Encounter: Payer: Self-pay | Admitting: Nurse Practitioner

## 2018-05-26 VITALS — Ht 70.0 in | Wt 152.8 lb

## 2018-05-26 DIAGNOSIS — F341 Dysthymic disorder: Secondary | ICD-10-CM | POA: Diagnosis not present

## 2018-05-26 MED ORDER — VENLAFAXINE HCL ER 37.5 MG PO CP24: 37.5000 mg | ORAL_CAPSULE | Freq: Every day | ORAL | 3 refills | Status: DC

## 2018-05-26 NOTE — Progress Notes (Signed)
Virtual Visit via Video Note  I connected with Megan Elliott on 05/26/18 at  2:00 PM EDT by a video enabled telemedicine application and verified that I am speaking with the correct person using two identifiers.   I discussed the limitations of evaluation and management by telemedicine and the availability of in person appointments. The patient expressed understanding and agreed to proceed.  Provider: [facility] Patient: Home  CC: f/up anxiety and Depression.  History of Present Illness:   Anxiety and Depression: Stable mood with effexor at PM and buspar prn. She is at home with parents. 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 She also works part time at night. Depression screen [facility] 2/9 03/25/2018 11/13/2017 11/13/2017  Decreased Interest 1 2 1   Down, Depressed, Hopeless 1 1 1   PHQ - 2 Score 2 3 2   Altered sleeping 0 1 -  Tired, decreased energy 2 2 -  Change in appetite 0 2 -  Feeling bad or failure about yourself  1 1 -  Trouble concentrating 1 3 -  Moving slowly or fidgety/restless 0 1 -  Suicidal thoughts 0 0 -  PHQ-9 Score 6 13 -   GAD 7 : Generalized Anxiety Score 03/25/2018 11/13/2017  Nervous, Anxious, on Edge 1 3  Control/stop worrying 2 3  Worry too much - different things 1 3  Trouble relaxing 1 3  Restless 1 2  Easily annoyed or irritable 1 3  Afraid - awful might happen 0 2  Total GAD 7 Score 7 19   Observations/Objective: Physical Exam  Constitutional: She is oriented to person, place, and time. No distress.  Pulmonary/Chest: Effort normal.  Neurological: She is alert and oriented to person, place, and time.  Psychiatric: She has a normal mood and affect. Her behavior is normal. Thought content normal.   Assessment and Plan: Myleka was seen today for follow-up.  Diagnoses and all orders for this visit:  Dysthymia -     venlafaxine XR (EFFEXOR XR) 37.5 MG 24 hr capsule; Take 1 capsule (37.5 mg total) by mouth daily with  breakfast.   Follow Up Instructions: Continue current medications. F/up in 21months in office. Maintain appt with counselor (ask about virtual visit) and/or use Headspace app.   I discussed the assessment and treatment plan with the patient. The patient was provided an opportunity to ask questions and all were answered. The patient agreed with the plan and demonstrated an understanding of the instructions.   The patient was advised to call back or seek an in-person evaluation if the symptoms worsen or if the condition fails to improve as anticipated.[name], NP

## 2018-05-26 NOTE — Patient Instructions (Signed)
Continue current medications. F/up in 66months in office. Maintain appt with counselor (ask about virtual visit) and/or use Headspace app.

## 2018-06-22 ENCOUNTER — Encounter: Payer: Self-pay | Admitting: Nurse Practitioner

## 2018-06-22 DIAGNOSIS — F341 Dysthymic disorder: Secondary | ICD-10-CM

## 2018-06-23 MED ORDER — VENLAFAXINE HCL ER 75 MG PO CP24: 75.0000 mg | ORAL_CAPSULE | Freq: Every day | ORAL | 3 refills | Status: DC

## 2018-07-22 ENCOUNTER — Other Ambulatory Visit (INDEPENDENT_AMBULATORY_CARE_PROVIDER_SITE_OTHER): Payer: Self-pay | Admitting: Family

## 2018-07-23 LAB — RPR: RPR: NONREACTIVE

## 2018-07-23 LAB — HEPATITIS B SURFACE ANTIGEN W/ REFLEX TO CONFIRMATION: Hepatitis B Surface Antigen: NEGATIVE

## 2018-07-24 LAB — CHLAMYDIA GONORRHOEAE NAA
CHLAMYDIA TRACHOMATIS, NAA: NEGATIVE
Neisseria gonorrhoeae, NAA: NEGATIVE

## 2018-07-24 LAB — HIV-2 ANTIBODIES: HIV-2 Ab-O.D. Ratio: NEGATIVE (ref <1.00)

## 2018-08-02 ENCOUNTER — Encounter: Payer: Self-pay | Admitting: Nurse Practitioner

## 2018-08-02 DIAGNOSIS — F341 Dysthymic disorder: Secondary | ICD-10-CM

## 2018-08-03 MED ORDER — VENLAFAXINE HCL ER 37.5 MG PO CP24: 37.5000 mg | ORAL_CAPSULE | Freq: Every day | ORAL | 5 refills | Status: DC

## 2018-08-13 DIAGNOSIS — T781XXA Other adverse food reactions, not elsewhere classified, initial encounter: Secondary | ICD-10-CM | POA: Insufficient documentation

## 2018-08-18 ENCOUNTER — Other Ambulatory Visit: Payer: Self-pay | Admitting: Nurse Practitioner

## 2018-08-18 DIAGNOSIS — B3731 Acute candidiasis of vulva and vagina: Secondary | ICD-10-CM

## 2018-08-18 DIAGNOSIS — B373 Candidiasis of vulva and vagina: Secondary | ICD-10-CM

## 2018-08-21 ENCOUNTER — Telehealth: Payer: Self-pay | Admitting: Nurse Practitioner

## 2018-08-21 DIAGNOSIS — F341 Dysthymic disorder: Secondary | ICD-10-CM

## 2018-08-21 NOTE — Telephone Encounter (Signed)
Medication Refill - Medication: venlafaxine XR (EFFEXOR-XR) 37.5 MG 24 hr capsule [[id number]]    Has the patient contacted their pharmacy? No. (Agent: If no, request that the patient contact the pharmacy for the refill.) (Agent: If yes, when and what did the pharmacy advise?)  Preferred Pharmacy (with phone number or street name): [facility] on [id number] [place] [id number]  Agent: Please be advised that RX refills may take up to 3 business days. We ask that you follow-up with your pharmacy. f

## 2018-08-24 MED ORDER — VENLAFAXINE HCL ER 37.5 MG PO CP24: 37.5000 mg | ORAL_CAPSULE | Freq: Every day | ORAL | 0 refills | Status: DC

## 2018-08-24 NOTE — Telephone Encounter (Signed)
Baldo Ash please advise, pt stated she is in [place] for the summer only--she will back on 09/12/2018 (so she just need temporary supply for 1 mo send to [facility] in [place]).   Ok to send in? Pt has 1 pill left.

## 2018-08-24 NOTE — Telephone Encounter (Signed)
Ok to send

## 2018-08-24 NOTE — Telephone Encounter (Signed)
[facility] mo supply sent to [facility] as pt requested. Will notify pt by my chart.

## 2018-08-29 ENCOUNTER — Encounter (INDEPENDENT_AMBULATORY_CARE_PROVIDER_SITE_OTHER): Payer: Self-pay

## 2018-08-31 ENCOUNTER — Ambulatory Visit: Payer: Managed Care, Other (non HMO) | Admitting: Nurse Practitioner

## 2018-09-01 ENCOUNTER — Telehealth (INDEPENDENT_AMBULATORY_CARE_PROVIDER_SITE_OTHER): Payer: Commercial Managed Care - POS | Admitting: Family

## 2018-09-01 ENCOUNTER — Encounter (INDEPENDENT_AMBULATORY_CARE_PROVIDER_SITE_OTHER): Payer: Self-pay | Admitting: Family

## 2018-09-01 DIAGNOSIS — N760 Acute vaginitis: Secondary | ICD-10-CM

## 2018-09-01 MED ORDER — FLUCONAZOLE 150 MG PO TABS
ORAL_TABLET | ORAL | 0 refills | Status: DC
Start: 2018-09-01 — End: 2018-09-09

## 2018-09-01 NOTE — Progress Notes (Signed)
[facility] [place] [facility]                       Date of Virtual Visit: 09/01/2018 4:06 PM        Patient ID: Tina Daugherty is a 23 y.o. female.  Attending Physician: Cecile Sheerer, FNP      This encounter was a video visit. Verbal consent has been obtained from the patient to conduct this video visit encounter. Patient was positively identified.  Verified patient location is within guidelines to complete this visit via video visit.        Chief Complaint:    Chief Complaint   Patient presents with    Vaginitis               HPI:    HPI     Pt c/o vaginal itching, discharge, abd pain, and odor x2 wks   - reports that the abdominal discomfort varies, only present for the past few days but she is getting ready to start her period also   - reports the odor is not as bad anymore, just odorous   - tried 3-day monistat with no improvement starting on 08-20-18, reports that she had a little bit of improvement with the burning, itching and discharge   - reports Hx of recurrent yeast infections since IUD was placed in 12-05-2018  - reports has not been sexually active since last STD testing on 07-22-18  - reports that she has had BV in the past but it was with chlamydia and a yeast infection at the same time  - denies fishy odor at this time   - denies urinary sxs, fever    Pt seen for VV for vaginal symptoms 07-21-18, had G&C/HIV/Syphillis testing at [place] on 07-22-18          Problem List:    Patient Active Problem List   Diagnosis    Corneal ulcer    Allergic rhinitis due to pollen    Asthma    Disorder of refraction and accommodation    Exercise-induced bronchospasm    Myopia    Adverse reaction to food             Current Meds:    No outpatient medications have been marked as taking for the 09/01/18 encounter (Telemedicine Visit) with Cecile Sheerer, FNP.          Allergies:    No Known Allergies          Past Surgical History:    Past Surgical History:   Procedure  Laterality Date    left acl             Family History:    Family History   Problem Relation Age of Onset    Hypertension Father            Social History:    Social History     Tobacco Use    Smoking status: Never Smoker    Smokeless tobacco: Never Used   Substance Use Topics    Alcohol use: No     Alcohol/week: 0.0 standard drinks    Drug use: No          The following sections were reviewed this encounter by the provider:   Tobacco   Allergies   Meds   Problems   Med Hx   Surg Hx   Fam Hx  Vital Signs:    LMP 09/01/2018 (Exact Date)          ROS:    Review of Systems   Constitutional: Negative for chills and fever.   Respiratory: Negative for cough and shortness of breath.    Cardiovascular: Negative for chest pain.   Neurological: Negative for dizziness and headaches.            Physical Exam:    Physical Exam  Nursing note reviewed.   Constitutional:       Appearance: Normal appearance.   HENT:      Head: Normocephalic.   Neurological:      Mental Status: She is alert and oriented to person, place, and time.   Psychiatric:         Behavior: Behavior normal.             Assessment / Plan:    1. Acute vaginitis  - fluconazole (DIFLUCAN) 150 MG tablet; Take one tablet po times one dose. Repeat in 72 hours.  Dispense: 2 tablet; Refill: 0    Will start on fluconazole as prescribed.  Will send FU message if symptoms not improving, will bring into the office for vaginal exam/swab.  Will FU with missing STD blood work results, was treated for chlamydia based on exposure but no results back.            Follow-up:    Return if symptoms worsen or fail to improve.         Cecile Sheerer, FNP

## 2018-09-07 ENCOUNTER — Encounter (INDEPENDENT_AMBULATORY_CARE_PROVIDER_SITE_OTHER): Payer: Self-pay | Admitting: Family

## 2018-09-08 NOTE — Progress Notes (Signed)
T/c. Pt reports sxs are less but still continue. Advised pt that we may need to evaluate in person. Pt agreeable and screened neg for COVID/exposure. Scheduled for 3:20 on 09-09-18.  -LS

## 2018-09-09 ENCOUNTER — Encounter (INDEPENDENT_AMBULATORY_CARE_PROVIDER_SITE_OTHER): Payer: Self-pay | Admitting: Family

## 2018-09-09 ENCOUNTER — Ambulatory Visit (INDEPENDENT_AMBULATORY_CARE_PROVIDER_SITE_OTHER): Payer: Commercial Managed Care - POS | Admitting: Family

## 2018-09-09 VITALS — BP 126/78 | HR 86 | Wt 148.0 lb

## 2018-09-09 DIAGNOSIS — Z975 Presence of (intrauterine) contraceptive device: Secondary | ICD-10-CM | POA: Insufficient documentation

## 2018-09-09 DIAGNOSIS — Z01419 Encounter for gynecological examination (general) (routine) without abnormal findings: Secondary | ICD-10-CM

## 2018-09-09 DIAGNOSIS — R300 Dysuria: Secondary | ICD-10-CM

## 2018-09-09 DIAGNOSIS — N760 Acute vaginitis: Secondary | ICD-10-CM

## 2018-09-09 LAB — POCT URINALYSIS DIPSTIX (10)(MULTI-TEST)
Bilirubin, UA POCT: NEGATIVE
Blood, UA POCT: NEGATIVE
Glucose, UA POCT: NEGATIVE mg/dL
Ketones, UA POCT: NEGATIVE mg/dL
Nitrite, UA POCT: NEGATIVE
POCT Leukocytes, UA: NEGATIVE
POCT Spec Gravity, UA: 1.03 (ref 1.001–1.035)
POCT pH, UA: 7 (ref 5–8)
Protein, UA POCT: NEGATIVE mg/dL
Urobilinogen, UA: 1 mg/dL

## 2018-09-09 NOTE — Progress Notes (Signed)
[facility] [place] [facility]                       Date of Exam: 09/09/2018 4:07 PM        Patient ID: Tina Daugherty is a 23 y.o. female.  Attending Physician: Cecile Sheerer, FNP        Chief Complaint:    Chief Complaint   Patient presents with    Vaginitis               HPI:    HPI   Vaginitis  - had STD testing the end of June, negative, treated for chlamydia due to exposure - no intercourse since testing   - has had vaginal issues for a few weeks now, took two doses of fluconazole, still reports itching   - continues to have abd discomfort, vaginal itching  - reports new sx of burning with urination  - denies discharge   - denies fever, chills  - just finished period yesterday          Problem List:    Patient Active Problem List   Diagnosis    Corneal ulcer    Allergic rhinitis due to pollen    Asthma    Disorder of refraction and accommodation    Exercise-induced bronchospasm    Myopia    Adverse reaction to food    IUD (intrauterine device) in place             Current Meds:    Outpatient Medications Marked as Taking for the 09/09/18 encounter (Office Visit) with Cecile Sheerer, FNP   Medication Sig Dispense Refill    ALBUTEROL SULFATE HFA IN Inhale 2 puffs into the lungs as needed.      busPIRone (BUSPAR) 7.5 MG tablet       cetirizine (ZYRTEC) 10 MG tablet Take 10 mg by mouth daily.      EPINEPHrine 0.3 MG/0.3ML Solution Auto-injector injection Auvi-Q 0.3 mg/0.3 mL injection, auto-injector   Take 1 auto as needed by injection route.      fluticasone (FLONASE) 50 MCG/ACT nasal spray SPRAY 2 SPRAYS INTO EACH NOSTRIL EVERY DAY      levonorgestrel (Mirena, 52 MG,) 20 MCG/24HR IUD Mirena 20 mcg/24 hours (5 yrs) 52 mg intrauterine device   Take by intrauterine route.      venlafaxine (EFFEXOR-XR) 37.5 MG 24 hr capsule TAKE 1 CAPSULE BY MOUTH EVERY DAY WITH BREAKFAST (NEW DOSE)            Allergies:    No Known Allergies          Past Surgical History:     Past Surgical History:   Procedure Laterality Date    left acl             Family History:    Family History   Problem Relation Age of Onset    Hypertension Father            Social History:    Social History     Tobacco Use    Smoking status: Never Smoker    Smokeless tobacco: Never Used   Substance Use Topics    Alcohol use: No     Alcohol/week: 0.0 standard drinks    Drug use: No          The following sections were reviewed this encounter by the provider:   Tobacco   Allergies   Meds   Problems  Med Hx   Surg Hx   Fam Hx              Vital Signs:    BP 126/78    Pulse 86    Wt 67.1 kg (148 lb)    LMP 09/01/2018 (Exact Date)    SpO2 97%          ROS:    Review of Systems   Constitutional: Negative for chills and fever.   Respiratory: Negative for cough and shortness of breath.    Cardiovascular: Negative for chest pain.   Genitourinary: Positive for dysuria.        Vaginal itching    Neurological: Negative for dizziness and headaches.              Physical Exam:    Physical Exam  Vitals signs and nursing note reviewed.   Constitutional:       Appearance: Normal appearance.   HENT:      Head: Normocephalic.   Cardiovascular:      Rate and Rhythm: Normal rate and regular rhythm.      Heart sounds: Normal heart sounds.   Pulmonary:      Breath sounds: Normal breath sounds.   Abdominal:      General: Abdomen is flat. Bowel sounds are normal.      Palpations: Abdomen is soft.   Genitourinary:     Exam position: Lithotomy position.      Pubic Area: No rash or pubic lice.       Vagina: Normal.      Cervix: Normal.      Comments: IUD strings seen.  Sample take for PAP and nuswab  Neurological:      Mental Status: She is alert and oriented to person, place, and time.   Psychiatric:         Behavior: Behavior normal.              Assessment/Plan    1. Acute vaginitis  - NuSwab BV and Candida, NAA    2. Dysuria  - POCT UA Dipstix (10)(Multi-Test)    3. Pap smear, as part of routine gynecological examination  - Pap  w rfx to hr HPV ASCUS, SIL, AGUS LCA    4. IUD (intrauterine device) in place    Will avoid any additional medication at this time.  Will wait for vaginal culture to return. If vaginal swab negative and symptoms continue may need to consider consult with GYN in regards to her her IUD as potential cause of symptoms.            Follow-up:    Return if symptoms worsen or fail to improve.         Cecile Sheerer, FNP

## 2018-09-11 LAB — NUSWAB BV AND CANDIDA, NAA
Candida albicans, NAA: NEGATIVE
Candida glabrata, NAA: NEGATIVE

## 2018-09-13 LAB — PAP WITH RFX TO HR HPV ASCUS, SIL, AGUS: .: 0

## 2018-09-18 ENCOUNTER — Ambulatory Visit (INDEPENDENT_AMBULATORY_CARE_PROVIDER_SITE_OTHER): Payer: Managed Care, Other (non HMO) | Admitting: Nurse Practitioner

## 2018-09-18 ENCOUNTER — Other Ambulatory Visit: Payer: Self-pay

## 2018-09-18 ENCOUNTER — Encounter: Payer: Self-pay | Admitting: Nurse Practitioner

## 2018-09-18 VITALS — BP 112/80 | HR 79 | Temp 98.4°F | Ht 70.0 in | Wt 153.4 lb

## 2018-09-18 DIAGNOSIS — F341 Dysthymic disorder: Secondary | ICD-10-CM | POA: Diagnosis not present

## 2018-09-18 DIAGNOSIS — K5900 Constipation, unspecified: Secondary | ICD-10-CM

## 2018-09-18 DIAGNOSIS — Z23 Encounter for immunization: Secondary | ICD-10-CM | POA: Diagnosis not present

## 2018-09-18 MED ORDER — VENLAFAXINE HCL ER 37.5 MG PO CP24: 37.5000 mg | ORAL_CAPSULE | Freq: Every day | ORAL | 3 refills | Status: DC

## 2018-09-18 MED ORDER — MAGNESIUM 250 MG PO TABS: 1.0000 | ORAL_TABLET | Freq: Every day | ORAL | 0 refills | Status: DC

## 2018-09-18 MED ORDER — LACTULOSE 10 GM/15ML PO SOLN: 20.0000 g | Freq: Every day | ORAL | 1 refills | Status: DC | PRN

## 2018-09-18 NOTE — Progress Notes (Signed)
Subjective:  Patient ID: Megan Elliott, female    DOB: May 26, 1995  Age: 23 y.o. MRN: [id number]  CC: Follow-up (follow up on anxiety and depression/ constipation issue-painful on upper abd at times when cant go-last BM was 2 days. wants flu shot. )  HPI Constipation: chronic, no improvement with OTC medications. Has ABD fullness and stool (pellets), no blood, no diarrhea, no weight loss  Anxiety and Depression: Stable mood with effexor, but worsening constipation. She stopped counseling session due to lack of provider in [place]. Reports non committed relationships but continues to engage in unprotected intercourse. Depression screen [facility] 2/9 09/18/2018 03/25/2018 11/13/2017  Decreased Interest 0 1 2  Down, Depressed, Hopeless 0 1 1  PHQ - 2 Score 0 2 3  Altered sleeping 1 0 1  Tired, decreased energy 1 2 2   Change in appetite 1 0 2  Feeling bad or failure about yourself  0 1 1  Trouble concentrating 0 1 3  Moving slowly or fidgety/restless 0 0 1  Suicidal thoughts 0 0 0  PHQ-9 Score 3 6 13    GAD 7 : Generalized Anxiety Score 09/18/2018 03/25/2018 11/13/2017  Nervous, Anxious, on Edge 1 1 3   Control/stop worrying 1 2 3   Worry too much - different things 0 1 3  Trouble relaxing 0 1 3  Restless 0 1 2  Easily annoyed or irritable 0 1 3  Afraid - awful might happen 0 0 2  Total GAD 7 Score 2 7 19    Reviewed past Medical, Social and Family history today.  Outpatient Medications Prior to Visit  Medication Sig Dispense Refill  . albuterol (PROVENTIL HFA;VENTOLIN HFA) 108 (90 Base) MCG/ACT inhaler INL 2 PFS PO Q 4 TO 6 H PRF CHEST TIGHTNESS OR WHZ    . busPIRone (BUSPAR) 7.5 MG tablet Take 1 tablet (7.5 mg total) by mouth 3 (three) times daily as needed. 90 tablet 5  . cetirizine (ZYRTEC) 10 MG tablet Take 1 tablet (10 mg total) by mouth daily. 30 tablet 0  . levonorgestrel (MIRENA, 52 MG,) 20 MCG/24HR IUD 1 Intra Uterine Device (1 each total) by Intrauterine route once for 1 dose.  Inserted 12/04/2017 by [facility] 1 each 0  . venlafaxine XR (EFFEXOR-XR) 37.5 MG 24 hr capsule Take 1 capsule (37.5 mg total) by mouth daily with breakfast. 30 capsule 0   No facility-administered medications prior to visit.     ROS See HPI  Objective:  BP 112/80   Pulse 79   Temp 98.4 F (36.9 C) (Tympanic)   Ht 5\' 10"  (1.778 m)   Wt 153 lb 6.4 oz (69.6 kg)   SpO2 96%   BMI 22.01 kg/m   BP Readings from Last 3 Encounters:  09/18/18 112/80  04/03/18 118/66  03/25/18 120/72    Wt Readings from Last 3 Encounters:  09/18/18 153 lb 6.4 oz (69.6 kg)  05/26/18 152 lb 12.8 oz (69.3 kg)  04/03/18 160 lb 12.8 oz (72.9 kg)    Physical Exam Constitutional:      General: She is not in acute distress. Cardiovascular:     Rate and Rhythm: Normal rate.     Pulses: Normal pulses.  Abdominal:     General: Bowel sounds are normal. There is no distension.     Palpations: Abdomen is soft.     Tenderness: There is no abdominal tenderness.  Neurological:     Mental Status: She is alert and oriented to person, place, and time.  Psychiatric:  Mood and Affect: Mood normal.        Behavior: Behavior normal.        Thought Content: Thought content normal.    Lab Results  Component Value Date   WBC 5.4 11/27/2017   HGB 13.1 11/27/2017   HCT 40.5 11/27/2017   PLT 206.0 11/27/2017   GLUCOSE 83 11/27/2017   ALT 13 11/27/2017   AST 18 11/27/2017   NA 144 11/27/2017   K 4.1 11/27/2017   CL 105 11/27/2017   CREATININE 0.82 11/27/2017   BUN 11 11/27/2017   CO2 29 11/27/2017   TSH 1.15 11/27/2017    Assessment & Plan:  I spent 15mins counseling on the importance of abstinence or use of condoms when not in committed relationship, and the importance of self care.  Donnamarie was seen today for follow-up.  Diagnoses and all orders for this visit:  Dysthymia -     venlafaxine XR (EFFEXOR-XR) 37.5 MG 24 hr capsule; Take 1 capsule (37.5 mg total) by mouth daily with  breakfast.  Constipation, unspecified constipation type -     Magnesium 250 MG TABS; Take 1 tablet (250 mg total) by mouth daily. -     lactulose (CHRONULAC) 10 GM/15ML solution; Take 30 mLs (20 g total) by mouth daily as needed for moderate constipation or severe constipation.  Need for influenza vaccination -     Flu Vaccine QUAD 36+ mos IM   I am having Meridith Beaston start on Magnesium and lactulose. I am also having her maintain her cetirizine, levonorgestrel, busPIRone, albuterol, and venlafaxine XR.  Meds ordered this encounter  Medications  . venlafaxine XR (EFFEXOR-XR) 37.5 MG 24 hr capsule    Sig: Take 1 capsule (37.5 mg total) by mouth daily with breakfast.    Dispense:  90 capsule    Refill:  3    Order Specific Question:   Supervising Provider    Answer:   Dianne DunARON, TALIA M [[id number]]  . Magnesium 250 MG TABS    Sig: Take 1 tablet (250 mg total) by mouth daily.    Refill:  0    Order Specific Question:   Supervising Provider    Answer:   Dianne DunARON, TALIA M [[id number]]  . lactulose (CHRONULAC) 10 GM/15ML solution    Sig: Take 30 mLs (20 g total) by mouth daily as needed for moderate constipation or severe constipation.    Dispense:  236 mL    Refill:  1    Order Specific Question:   Supervising Provider    Answer:   Dianne DunARON, TALIA M [[id number]]   Problem List Items Addressed This Visit      Other   Constipation   Relevant Medications   Magnesium 250 MG TABS   lactulose (CHRONULAC) 10 GM/15ML solution   Depression - Primary   Relevant Medications   venlafaxine XR (EFFEXOR-XR) 37.5 MG 24 hr capsule    Other Visit Diagnoses    Need for influenza vaccination       Relevant Orders   Flu Vaccine QUAD 36+ mos IM (Completed)      Follow-up: Return in about 6 months (around 03/21/2019) for CPE (fasting).  Alysia Pennaharlotte Sahej Schrieber, NP

## 2018-09-18 NOTE — Patient Instructions (Addendum)
Continue therapy sessions.  Constipation, Adult Constipation is when a person:  Poops (has a bowel movement) fewer times in a week than normal.  Has a hard time pooping.  Has poop that is dry, hard, or bigger than normal. Follow these instructions at home: Eating and drinking   Eat foods that have a lot of fiber, such as: ? Fresh fruits and vegetables. ? Whole grains. ? Beans.  Eat less of foods that are high in fat, low in fiber, or overly processed, such as: ? [place] fries. ? Hamburgers. ? Cookies. ? Candy. ? Soda.  Drink enough fluid to keep your pee (urine) clear or pale yellow. General instructions  Exercise regularly or as told by your doctor.  Go to the restroom when you feel like you need to poop. Do not hold it in.  Take over-the-counter and prescription medicines only as told by your doctor. These include any fiber supplements.  Do pelvic floor retraining exercises, such as: ? Doing deep breathing while relaxing your lower belly (abdomen). ? Relaxing your pelvic floor while pooping.  Watch your condition for any changes.  Keep all follow-up visits as told by your doctor. This is important. Contact a doctor if:  You have pain that gets worse.  You have a fever.  You have not pooped for 4 days.  You throw up (vomit).  You are not hungry.  You lose weight.  You are bleeding from the anus.  You have thin, pencil-like poop (stool). Get help right away if:  You have a fever, and your symptoms suddenly get worse.  You leak poop or have blood in your poop.  Your belly feels hard or bigger than normal (is bloated).  You have very bad belly pain.  You feel dizzy or you faint. This information is not intended to replace advice given to you by your health care provider. Make sure you discuss any questions you have with your health care provider. Document Released: 07/03/2007 Document Revised: 12/27/2016 Document Reviewed: 07/05/2015 Elsevier  Patient Education  2020 [employer].

## 2018-09-21 ENCOUNTER — Encounter: Payer: Self-pay | Admitting: Nurse Practitioner

## 2019-01-27 ENCOUNTER — Encounter (INDEPENDENT_AMBULATORY_CARE_PROVIDER_SITE_OTHER): Payer: Self-pay | Admitting: Family

## 2019-02-02 ENCOUNTER — Ambulatory Visit (INDEPENDENT_AMBULATORY_CARE_PROVIDER_SITE_OTHER): Payer: Commercial Managed Care - POS | Admitting: Family

## 2019-02-02 ENCOUNTER — Encounter (INDEPENDENT_AMBULATORY_CARE_PROVIDER_SITE_OTHER): Payer: Self-pay | Admitting: Family

## 2019-02-02 VITALS — BP 110/72 | HR 81 | Temp 98.8°F | Wt 156.0 lb

## 2019-02-02 DIAGNOSIS — F418 Other specified anxiety disorders: Secondary | ICD-10-CM

## 2019-02-02 DIAGNOSIS — K5900 Constipation, unspecified: Secondary | ICD-10-CM

## 2019-02-02 DIAGNOSIS — K219 Gastro-esophageal reflux disease without esophagitis: Secondary | ICD-10-CM

## 2019-02-02 DIAGNOSIS — J3081 Allergic rhinitis due to animal (cat) (dog) hair and dander: Secondary | ICD-10-CM

## 2019-02-02 MED ORDER — OMEPRAZOLE 20 MG PO CPDR
20.0000 mg | DELAYED_RELEASE_CAPSULE | Freq: Every day | ORAL | 0 refills | Status: DC
Start: 2019-02-02 — End: 2019-02-28

## 2019-02-02 MED ORDER — VENLAFAXINE HCL ER 75 MG PO CP24
75.0000 mg | ORAL_CAPSULE | Freq: Every day | ORAL | 0 refills | Status: DC
Start: 2019-02-02 — End: 2019-02-09

## 2019-02-02 MED ORDER — CETIRIZINE HCL 10 MG PO TABS
10.0000 mg | ORAL_TABLET | Freq: Every day | ORAL | 3 refills | Status: DC
Start: 2019-02-02 — End: 2022-10-04

## 2019-02-02 NOTE — Progress Notes (Signed)
[facility] [place] [facility]                       Date of Exam: 02/02/2019 5:09 PM        Patient ID: Tina Daugherty is a 24 y.o. female.  Attending Physician: Carlos Levering, NP        Chief Complaint:    Chief Complaint   Patient presents with    Heartburn    Anxiety    Depression    Allergies               HPI:    Patient presents for heartburn  - Has happened in the past, but has been consistent for about 2 weeks now  - Pt reports that it is mostly constant, burning sensation in the chest, mostly to the left side  - Pt reports that laying down and sitting down makes it worse  - Pt reports eating makes it feel better sometimes  - Pt reports she doesn't drink much caffeine and doesn't eat much acidic foods except fruits  - Pt tends to snack a lot versus eating full meals  - Pt reports she was taking tums, which did not help  - Pt reports she also tried nexium OTC which didn't help  - Reports feeling tight, increased gas  - 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 so has had change in sleep pattern  - Will eat on shift around 230 am, usually microwaved meal  - Denies palpitations, chest pain  - Admits to increase in constipation since August  - Will go 1-2 BMs per day but will be hard and small  - Denies blood in stool    Patient requests refill of zyrtec  - Pt takes year round for environmental (cats and pollen) allergies  - Pt reports that it works well at preventing her symptoms but interested in increasing dosage if possible    Patient requests refill of venlafaxine 37.5 mg daily  - Pt takes for anxiety and depression  - Has been taking for about a year  - Feels that it works well usually, but interested in increasing dose to help symptoms more  - Pt reports feeling down and also situational anxiety  - Pt denies self-harm, SI/HI  - Pt reports dry mouth with medication and decreased concentration/increased forgetfulness  - Sleeps during the day and works night shift,  usually gets 5-8 hrs of sleep/day, but is inconsistent  - No changes in appetite  - She 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  - Will return next year  - Believes the change in her routine has led to worsening symptoms            Problem List:    Patient Active Problem List   Diagnosis    Corneal ulcer    Allergic rhinitis due to pollen    Asthma    Disorder of refraction and accommodation    Exercise-induced bronchospasm    Myopia    Adverse reaction to food    IUD (intrauterine device) in place             Current Meds:    Outpatient Medications Marked as Taking for the 02/02/19 encounter (Office Visit) with Carlos Levering, NP   Medication Sig Dispense Refill    ALBUTEROL SULFATE HFA IN Inhale 2 puffs into the lungs as needed.      busPIRone (BUSPAR)  7.5 MG tablet Take 7.5 mg by mouth as needed         cetirizine (ZyrTEC) 10 MG tablet Take 1 tablet (10 mg total) by mouth daily 90 tablet 3    EPINEPHrine 0.3 MG/0.3ML Solution Auto-injector injection 0.3 mg once         fluticasone (FLONASE) 50 MCG/ACT nasal spray 2 sprays by Nasal route daily         levonorgestrel (Mirena, 52 MG,) 20 MCG/24HR IUD 1 each by Intrauterine route once         [DISCONTINUED] cetirizine (ZYRTEC) 10 MG tablet Take 10 mg by mouth daily.      [DISCONTINUED] Magnesium 250 MG Tab Take 250 mg by mouth daily      [DISCONTINUED] venlafaxine (EFFEXOR-XR) 37.5 MG 24 hr capsule Take 37.5 mg by mouth daily               Allergies:    Allergies   Allergen Reactions    Peanut (Diagnostic) Anaphylaxis, Anxiety, Hives, Nausea Only, Shortness Of Breath and Wheezing             Past Surgical History:    Past Surgical History:   Procedure Laterality Date    ANTERIOR CRUCIATE LIGAMENT REPAIR  02/10/2015    ACL Reconstruction    left acl             Family History:    Family History   Problem Relation Age of Onset    Hypertension Father     Diabetes Maternal Grandmother     Diabetes Maternal Grandfather      Seizures Paternal Grandmother     Other Paternal Grandmother         family history of stroke           Social History:    Social History     Tobacco Use    Smoking status: Never Smoker    Smokeless tobacco: Never Used   Substance Use Topics    Alcohol use: Yes     Alcohol/week: 4.0 standard drinks     Types: 4 Glasses of wine per week    Drug use: No          The following sections were reviewed this encounter by the provider:   Tobacco   Allergies   Meds   Problems   Med Hx   Surg Hx   Fam Hx              Vital Signs:    BP 110/72 (BP Site: Left arm, Patient Position: Sitting, Cuff Size: Medium)    Pulse 81    Temp 98.8 F (37.1 C) (Temporal)    Wt 70.8 kg (156 lb)    LMP 01/20/2019    SpO2 97%    BMI 22.64 kg/m          ROS:    Review of Systems   Respiratory: Negative for cough, chest tightness and shortness of breath.    Cardiovascular: Negative for chest pain and palpitations.   Gastrointestinal: Positive for constipation. Negative for abdominal pain, blood in stool, diarrhea and nausea.   Allergic/Immunologic: Positive for environmental allergies.              Physical Exam:    Physical Exam  Constitutional:       Appearance: Normal appearance. She is normal weight.   HENT:      Head: Normocephalic.   Eyes:      Pupils: Pupils are equal,  round, and reactive to light.   Neck:      Musculoskeletal: Neck supple.   Cardiovascular:      Rate and Rhythm: Normal rate and regular rhythm.   Pulmonary:      Effort: Pulmonary effort is normal.      Breath sounds: Normal breath sounds.   Abdominal:      General: Bowel sounds are normal.      Palpations: Abdomen is soft.      Tenderness: There is no abdominal tenderness.   Skin:     General: Skin is warm and dry.   Neurological:      Mental Status: She is alert and oriented to person, place, and time.   Psychiatric:         Mood and Affect: Mood normal.              Assessment/Plan:    1. Mixed anxiety and depressive disorder  - venlafaxine (Effexor XR) 75 MG 24 hr  capsule; Take 1 capsule (75 mg total) by mouth daily  Dispense: 30 capsule; Refill: 0  - Increase in symptoms will adjust dose from 37.5 to 75 mg  - Lifestyle modifications including adequate rest, exercise, meditation  - Monitor for worsening symptoms  - Follow up 3-4 weeks    2. Allergic rhinitis due to animal hair and dander  - cetirizine (ZyrTEC) 10 MG tablet; Take 1 tablet (10 mg total) by mouth daily  Dispense: 90 tablet; Refill: 3  - Takes zyrtec year round for cat dander and environmental allergies  - Follow up annually, sooner if worsening symptoms    3. Gastroesophageal reflux disease, unspecified whether esophagitis present  - omeprazole (PriLOSEC) 20 MG capsule; Take 1 capsule (20 mg total) by mouth daily  Dispense: 30 capsule; Refill: 0  - Suspect worsening GERD triggered by night shift, stress  - Monitor and reduce triggers (caffeine, citrus foods, alcohol)  - Advised to eat light, bland meal during night shift  - Avoid eating close to bed time  - Will re-assess at follow up in 3 weeks, if persists may increase to 40 mg and consider GI referral    4.  Constipation  - Advised to get at least 64 oz water per day  - Increase fiber, recommend daily supplement  - Colace prn  - Follow up if worsening symptoms                Follow-up:    No follow-ups on file.         Carlos Levering, NP

## 2019-02-09 ENCOUNTER — Other Ambulatory Visit (INDEPENDENT_AMBULATORY_CARE_PROVIDER_SITE_OTHER): Payer: Self-pay | Admitting: Family

## 2019-02-09 ENCOUNTER — Emergency Department
Admission: EM | Admit: 2019-02-09 | Discharge: 2019-02-09 | Disposition: A | Discharge status: Home / Self Care | Payer: Commercial Managed Care - POS | Attending: Emergency Medicine | Admitting: Emergency Medicine

## 2019-02-09 ENCOUNTER — Emergency Department: Payer: Commercial Managed Care - POS

## 2019-02-09 ENCOUNTER — Telehealth (INDEPENDENT_AMBULATORY_CARE_PROVIDER_SITE_OTHER): Payer: Self-pay | Admitting: Family

## 2019-02-09 DIAGNOSIS — Z20828 Contact with and (suspected) exposure to other viral communicable diseases: Secondary | ICD-10-CM | POA: Insufficient documentation

## 2019-02-09 DIAGNOSIS — R0602 Shortness of breath: Secondary | ICD-10-CM

## 2019-02-09 DIAGNOSIS — J4521 Mild intermittent asthma with (acute) exacerbation: Secondary | ICD-10-CM | POA: Insufficient documentation

## 2019-02-09 DIAGNOSIS — F418 Other specified anxiety disorders: Secondary | ICD-10-CM

## 2019-02-09 LAB — CBC AND DIFFERENTIAL
Absolute NRBC: 0 10*3/uL (ref 0.00–0.00)
Basophils Absolute Automated: 0.02 10*3/uL (ref 0.00–0.08)
Basophils Automated: 0.4 %
Eosinophils Absolute Automated: 0.01 10*3/uL (ref 0.00–0.44)
Eosinophils Automated: 0.2 %
Hematocrit: 40.7 % (ref 34.7–43.7)
Hgb: 13.3 g/dL (ref 11.4–14.8)
Immature Granulocytes Absolute: 0.02 10*3/uL (ref 0.00–0.07)
Immature Granulocytes: 0.4 %
Lymphocytes Absolute Automated: 1.66 10*3/uL (ref 0.42–3.22)
Lymphocytes Automated: 29.3 %
MCH: 27.9 pg (ref 25.1–33.5)
MCHC: 32.7 g/dL (ref 31.5–35.8)
MCV: 85.5 fL (ref 78.0–96.0)
MPV: 10.2 fL (ref 8.9–12.5)
Monocytes Absolute Automated: 0.38 10*3/uL (ref 0.21–0.85)
Monocytes: 6.7 %
Neutrophils Absolute: 3.58 10*3/uL (ref 1.10–6.33)
Neutrophils: 63 %
Nucleated RBC: 0 /100 WBC (ref 0.0–0.0)
Platelets: 211 10*3/uL (ref 142–346)
RBC: 4.76 10*6/uL (ref 3.90–5.10)
RDW: 12 % (ref 11–15)
WBC: 5.67 10*3/uL (ref 3.10–9.50)

## 2019-02-09 LAB — COMPREHENSIVE METABOLIC PANEL
ALT: 9 U/L (ref 0–55)
AST (SGOT): 15 U/L (ref 5–34)
Albumin/Globulin Ratio: 1.6 (ref 0.9–2.2)
Albumin: 4.7 g/dL (ref 3.5–5.0)
Alkaline Phosphatase: 36 U/L — ABNORMAL LOW (ref 37–106)
Anion Gap: 8 (ref 5.0–15.0)
BUN: 12 mg/dL (ref 7.0–19.0)
Bilirubin, Total: 1.1 mg/dL (ref 0.2–1.2)
CO2: 26 mEq/L (ref 22–29)
Calcium: 8.9 mg/dL (ref 8.5–10.5)
Chloride: 105 mEq/L (ref 100–111)
Creatinine: 0.9 mg/dL (ref 0.6–1.0)
Globulin: 2.9 g/dL (ref 2.0–3.6)
Glucose: 86 mg/dL (ref 70–100)
Potassium: 4.5 mEq/L (ref 3.5–5.1)
Protein, Total: 7.6 g/dL (ref 6.0–8.3)
Sodium: 139 mEq/L (ref 136–145)

## 2019-02-09 LAB — TROPONIN I: Troponin I: <0.01 ng/mL (ref 0.00–0.05)

## 2019-02-09 LAB — GFR: EGFR: >60

## 2019-02-09 LAB — HCG, SERUM, QUALITATIVE: Hcg Qualitative: NEGATIVE

## 2019-02-09 LAB — IHS D-DIMER: D-Dimer: <0.27 ug/mL FEU (ref 0.00–0.50)

## 2019-02-09 MED ORDER — VENLAFAXINE HCL ER 75 MG PO CP24
75.0000 mg | ORAL_CAPSULE | Freq: Every day | ORAL | 0 refills | Status: DC
Start: 2019-02-09 — End: 2019-02-09

## 2019-02-09 MED ORDER — VENLAFAXINE HCL ER 75 MG PO CP24
75.0000 mg | ORAL_CAPSULE | Freq: Every day | ORAL | 0 refills | Status: DC
Start: 2019-02-09 — End: 2019-03-02

## 2019-02-09 MED ORDER — PREDNISONE 20 MG PO TABS
40.00 mg | ORAL_TABLET | Freq: Every day | ORAL | 0 refills | Status: AC
Start: 2019-02-09 — End: 2019-02-14

## 2019-02-09 NOTE — Discharge Instructions (Signed)
Isolate until covid test negative and your symptoms are improving.    Asthma    You have been seen for an asthma attack.    Asthma is also called reactive airway disease.    This disease occurs when the breathing tubes are irritated and begin to spasm. The irritation causes the tubes to swell and make extra mucus. The spasms make it difficult to breathe, especially when exhaling (breathing out). Some medications treat the spasm. An example is bronchodilators, like albuterol (Ventolin/Proventil). Other medications treat the inflammation. An example is steroids, like prednisone (Prelone/Orapred).     Symptoms may include wheezing, dry coughing, difficulty breathing, and chest pain and tightness. Coughing until you vomit may also be a sign of asthma.    Asthma attacks can be caused by medications (like aspirin), smoke, air pollution, exercise, dust mites or pet dander.    Asthma attacks are serious. They can be life-threatening!    Do not smoke. Research has proven that smoking causes heart disease, cancer, and birth defects. Avoiding smoking will help your asthma. If you smoke, ask your doctor for ideas about how to stop.   If you do not smoke, avoid others who do. Smoke will irritate your lungs and make your asthma worse.    Avoid things that irritate your lungs. These might include smoke, pollen, dust, mold, mildew, pets and perfumes. If you are allergic to pollen, then stay indoors when pollen counts are high.    Work closely with your doctor to control your asthma.    Use your albuterol (Ventolin/Proventil) inhaler every 4 hours as needed for wheezing, coughing or shortness of breath. Using this medication as directed will help symptoms. It is best to use a spacer to help the medicine reach your lungs. Your doctor can prescribe a spacer.    Discuss with your doctor how to measure your breathing with a handheld peak flow meter. This will help your doctor to adjust your treatment.    Do not wait  until your inhaler is empty before getting a refill. To check how full your inhaler is, remove the mouthpiece and place the inhaler in a bowl of water. If it floats near the surface of the water with the nipple end pointing down, it is half full. If it floats flat on the surface, it is empty. Call your doctor immediately for a refill.    IT IS VERY IMPORTANT to use the inhaler as instructed. Using it too much can cause serious problems.    YOU SHOULD SEEK MEDICAL ATTENTION IMMEDIATELY, EITHER HERE OR AT THE NEAREST EMERGENCY DEPARTMENT, IF ANY OF THE FOLLOWING OCCURS:   You do not improve in 48 to 72 hours or your symptoms get worse.   You vomit, you cannot keep medication down, or you feel dizzy, weak, or confused.   Your shortness of breath gets worse.   Your inhaler does not help your breathing.   You feel chest pain.   You cough up green or yellow material.   You have a fever (temperature higher than 100.76F / 38C).   You wheeze or have difficulty breathing.             Follow-up Steps for Patients with Pending COVID-19 Testing     Thank you for choosing [facility].  During your visit you received a test for COVID-19 and the test performed takes 2-3 days to result.  Occasionally it can take longer.  We appreciate your patience.  What should I do while I am waiting for my test results?     ? Take good care of yourself and be mindful of the safety of those around you:  ? Rest and stay well hydrated.  ? Use acetaminophen (Tylenol) to help manage fever.  ? Wash your hands often.  ? Stay at home except to get medical care.  ? Call ahead before seeing your doctor, any other medical provider, or seeking medical care at any healthcare facility.  ? Isolate yourself as much as possible.  ? Clean all "high touch" surfaces every day.  ? Most people with COVID-19 infection will get better; however, a small number will develop worsening infection which may require more intensive treatment.  Please  contact your doctor or present to the nearest Emergency Department if you develop the warning signs of more severe infection such as:    ? A fever over 102 not controlled by acetaminophen (Tylenol)  ? Shortness of breath or chest pain  ? Worsening cough  ? Increasing weakness  ? Inability to tolerate oral fluids  ? Some general things to think about:  -      If you have a medical emergency and need to call 911, notify the person answering the phone that you have, or are being evaluated for COVID-19. If possible, put on a facemask before the ambulance arrives.    -      Persons who are placed under active monitoring or facilitated self-monitoring should follow instructions provided by their local health department or occupational health professionals, as appropriate.      When will I know the results of my COVID test?     After a minimum of 2-3 days has passed, there are multiple ways in which you can be informed about the results of your COVID 19 test results.    MyChart - you may be notified of results through your MyChart account.  The easiest way to find all test results done at an [facility] facility is through your MyChart account, [facility] online patient portal. This is also an easy way to connect with your [facility] primary care team for continued care, if this applies to you.  If you do not already have MyChart activated, you will be given an activation code at the time of testing.      You may receive a call from a representative of the emergency department who ordered the test with the results.  You may also receive a call from [facility].    If none of the options above has helped you obtain your results, or you have additional questions, please contact the COVID 19 Notifications Call Center for further instructions at [phone], open 5 days a week, Monday-Friday from 8:30am-5:00pm.    If you need help activating your MyChart account, please let us know.  You can also download the MyChart application to your phone  using the QR code below.           When can I stop isolation?    Until you receive your results, you should remain home and avoid contact with others (self-isolate).  The decision to stop isolation precautions should be made on a case-by-case basis, in consultation with healthcare providers and state and local health departments.    Here is some general advice:     If your test result is negative, you should stay home until you do not have a fever (without the use of fever reducing medication) or any  respiratory symptoms for at least 3 days.     If your test result is positive, you should stay home for at least 10 days from the day your symptoms started. If you are still having symptoms at the end of 10 days, continue in-home isolation until 3 days after your symptoms stop. Talk to your healthcare provider to determine what follow-up is needed.  You can also schedule telemedicine visits with your primary care physician to discuss ongoing symptoms or new concerns that may arise.    For further information on what you and others in your household should do please visit the Center for Disease Control (CDC) website @ [employer]si.html    If you are ill and need to be seen by a healthcare provider, and your primary physician is not available, the [facility] are open for walk in visits.  [facility] - open M - F 8 am to 8 pm at the following locations:  [facility]: [address] [place], [place] [address]  Open Daily 8am - 8pm including weekends at the following location:  [facility]: [address] [place], [place] [address]    In addition, all [facility] Emergency Departments can care for patients with all conditions, both COVID and non-COVID.  Do not hesitate to seek care should you have a healthcare emergency.

## 2019-02-09 NOTE — Addendum Note (Signed)
Addended by: Carlos Levering on: 02/09/2019 03:44 PM     Modules accepted: Orders

## 2019-02-09 NOTE — ED Provider Notes (Signed)
History     Chief Complaint   Patient presents with    Shortness of Breath     24 year old female with history of exercise-induced asthma since she was 24 years old presents to the emergency department with shortness of breath for several days.  She feels like it is her asthma but in the past her asthma only bothers her when she exerted herself.  She tried her albuterol which improved her symptoms.  Today the shortness of breath is worse than normal so she called and scheduled Covid test in 2 days.  Because it was not better her parents want her to come in to be checked out.  She has chest tightness in her upper mid chest.  Nothing makes it worse.  Is better with albuterol.  She has mild shortness of breath.  No fevers or chills.  No nausea vomiting diarrhea.  No abdominal pain.  No headache.  No dizziness.  No known Covid exposures.    The history is provided by the patient.   Shortness of Breath  Associated symptoms: no abdominal pain, no chest pain, no cough, no fever, no headaches, no rash, no sore throat and no vomiting         Past Medical History:   Diagnosis Date    Adverse reaction to food     Allergic conjunctivitis     Allergic rhinitis     T/G/W/Mo/DM/Cat    Asthma without status asthmaticus     excercise induced    Pollen-food allergy        Past Surgical History:   Procedure Laterality Date    ANTERIOR CRUCIATE LIGAMENT REPAIR  02/10/2015    ACL Reconstruction    left acl         Family History   Problem Relation Age of Onset    Hypertension Father     Diabetes Maternal Grandmother     Diabetes Maternal Grandfather     Seizures Paternal Grandmother     Other Paternal Grandmother         family history of stroke       Social  Social History     Tobacco Use    Smoking status: Never Smoker    Smokeless tobacco: Never Used   Substance Use Topics    Alcohol use: Yes     Alcohol/week: 4.0 standard drinks     Types: 4 Glasses of wine per week    Drug use: No       .     Allergies    Allergen Reactions    Peanut (Diagnostic) Anaphylaxis, Anxiety, Hives, Nausea Only, Shortness Of Breath and Wheezing       Home Medications     Med List Status: In Progress Set By: Kyra Manges, RN at 02/09/2019  9:24 PM                ALBUTEROL SULFATE HFA IN     Inhale 2 puffs into the lungs as needed.     busPIRone (BUSPAR) 7.5 MG tablet     Take 7.5 mg by mouth as needed        cetirizine (ZyrTEC) 10 MG tablet     Take 1 tablet (10 mg total) by mouth daily     EPINEPHrine 0.3 MG/0.3ML Solution Auto-injector injection     0.3 mg once        fluticasone (FLONASE) 50 MCG/ACT nasal spray     2 sprays by Nasal route daily  levonorgestrel (Mirena, 52 MG,) 20 MCG/24HR IUD     1 each by Intrauterine route once        omeprazole (PriLOSEC) 20 MG capsule     Take 1 capsule (20 mg total) by mouth daily     venlafaxine (Effexor XR) 75 MG 24 hr capsule     Take 1 capsule (75 mg total) by mouth daily                               Review of Systems   Constitutional: Negative for chills and fever.   HENT: Negative for congestion, rhinorrhea and sore throat.    Respiratory: Positive for chest tightness and shortness of breath. Negative for cough.    Cardiovascular: Negative for chest pain and palpitations.   Gastrointestinal: Negative for abdominal pain, diarrhea, nausea and vomiting.   Genitourinary: Negative for dysuria and frequency.   Musculoskeletal: Negative for back pain and myalgias.   Skin: Negative for color change and rash.   Neurological: Negative for dizziness and headaches.   Psychiatric/Behavioral: Negative for confusion. The patient is not nervous/anxious.        Physical Exam    BP: 131/84, Heart Rate: 76, Temp: 98.2 F (36.8 C), Resp Rate: 20, SpO2: 100 %, Weight: 75.3 kg    Physical Exam  Vitals signs and nursing note reviewed.   Constitutional:       Appearance: Normal appearance. She is well-developed.   HENT:      Head: Normocephalic and atraumatic.   Eyes:      General:         Right eye: No  discharge.         Left eye: No discharge.      Conjunctiva/sclera: Conjunctivae normal.   Neck:      Musculoskeletal: Normal range of motion and neck supple.   Cardiovascular:      Rate and Rhythm: Normal rate and regular rhythm.      Heart sounds: Normal heart sounds.   Pulmonary:      Effort: Pulmonary effort is normal. No respiratory distress.      Breath sounds: Normal breath sounds. No wheezing or rales.   Abdominal:      General: There is no distension.      Palpations: Abdomen is soft.      Tenderness: There is no abdominal tenderness. There is no guarding or rebound.   Musculoskeletal: Normal range of motion.         General: No tenderness.      Right lower leg: No edema.      Left lower leg: No edema.   Skin:     General: Skin is warm and dry.   Neurological:      General: No focal deficit present.      Mental Status: She is alert and oriented to person, place, and time.      Cranial Nerves: No cranial nerve deficit.   Psychiatric:         Mood and Affect: Mood normal.         Behavior: Behavior normal.         Thought Content: Thought content normal.         Judgment: Judgment normal.           MDM and ED Course     ED Medication Orders (From admission, onward)    None  MDM  Number of Diagnoses or Management Options  Mild intermittent asthma with acute exacerbation:   Shortness of breath:   Diagnosis management comments: Differential diagnosis: Asthma, doubt pneumonia, doubt PE, Covid  Data: Labs, supportive care, chest x-ray    I, Nita Sells, M.D, have been the primary provider for [name] during this Emergency Dept visit.  Oxygen saturation by pulse oximetry is 95%-100%, Normal.  Interventions: None Needed    I wore the appropriate PPE while caring for this patient.  .    EKG Interpretation by Nita Sells, MD, ED physician:  Rate:  Normal  Rhythm:  Normal Sinus  Axis:  Normal  Conduction:  No blocks  ST Segments:  No acute changes  Other findings:  Low voltage  Q Waves:  None seen  Clinical  Impression:  Non-specific EKG    Patient is well-appearing.  We will send Covid test.  Will treat with steroids in case it is an asthma flare that her lungs sound normal to me.  She will follow-up with pulmonology.  Will return if she has worsening shortness of breath or any concerns.    Pt feels well. Will isolate until covid test comes back and return if worsens.       Amount and/or Complexity of Data Reviewed  Clinical lab tests: ordered and reviewed  Tests in the radiology section of CPT: ordered and reviewed    Patient Progress  Patient progress: improved             Radiology Results (24 Hour)     Procedure Component Value Units Date/Time    Chest AP Portable [[id number]] Collected: 02/09/19 2316    Order Status: Completed Updated: 02/09/19 2320    Narrative:      HISTORY: SOB.  24 year old female with history of asthma. The patient  complains of dyspnea.    COMPARISON: No chest x-ray for comparison.    TECHNIQUE: XR CHEST AP PORTABLE: AP portable upright    FINDINGS: No indwelling lines are appreciated.  Multiple cardiac  monitoring wires overlie the radiographic field. There is no  silhouetting of the diaphragms or cardio-mediastinal contours. The lungs  are well-expanded and clear. The pulmonary vasculature is within normal  limits. Kerley B-lines are not visible at the lung periphery. There is  no pneumothorax or pleural effusion. The heart is not enlarged. There is  no mediastinal shift and the upper mediastinum does not appear widened.  Hilar structures are within normal limits. No free air is detected under  the diaphragms. No significant bony lesions are seen. The bony thorax  and surrounding bony structures appear intact.      Impression:       Radiographic examination of the chest shows no acute  abnormality. The lungs are well-expanded and clear. There is no evidence  of pneumonia.        Miguel Dibble, MD   02/09/2019 11:18 PM        Results     Procedure Component Value Units Date/Time    Troponin I  [[id number]] Collected: 02/09/19 2150    Specimen: Blood Updated: 02/09/19 2250     Troponin I <0.01 ng/mL     Comprehensive metabolic panel [[id number]]  (Abnormal) Collected: 02/09/19 2150    Specimen: Blood Updated: 02/09/19 2250     Glucose 86 mg/dL      BUN 08.6 mg/dL      Creatinine 0.9 mg/dL      Sodium 578 mEq/L  Potassium 4.5 mEq/L      Chloride 105 mEq/L      CO2 26 mEq/L      Calcium 8.9 mg/dL      Protein, Total 7.6 g/dL      Albumin 4.7 g/dL      AST (SGOT) 15 U/L      ALT 9 U/L      Alkaline Phosphatase 36 U/L      Bilirubin, Total 1.1 mg/dL      Globulin 2.9 g/dL      Albumin/Globulin Ratio 1.6     Anion Gap 8.0    GFR [[id number]] Collected: 02/09/19 2150     Updated: 02/09/19 2250     EGFR >60.0    D-Dimer [[id number]] Collected: 02/09/19 2150     Updated: 02/09/19 2244     D-Dimer <0.27 ug/mL FEU     Beta HCG, Qual, Serum [[id number]] Collected: 02/09/19 2150    Specimen: Blood Updated: 02/09/19 2240     Hcg Qualitative Negative    CBC and differential [[id number]] Collected: 02/09/19 2150    Specimen: Blood Updated: 02/09/19 2232     WBC 5.67 x10 3/uL      Hgb 13.3 g/dL      Hematocrit 32.4 %      Platelets 211 x10 3/uL      RBC 4.76 x10 6/uL      MCV 85.5 fL      MCH 27.9 pg      MCHC 32.7 g/dL      RDW 12 %      MPV 10.2 fL      Neutrophils 63.0 %      Lymphocytes Automated 29.3 %      Monocytes 6.7 %      Eosinophils Automated 0.2 %      Basophils Automated 0.4 %      Immature Granulocytes 0.4 %      Nucleated RBC 0.0 /100 WBC      Neutrophils Absolute 3.58 x10 3/uL      Lymphocytes Absolute Automated 1.66 x10 3/uL      Monocytes Absolute Automated 0.38 x10 3/uL      Eosinophils Absolute Automated 0.01 x10 3/uL      Basophils Absolute Automated 0.02 x10 3/uL      Immature Granulocytes Absolute 0.02 x10 3/uL      Absolute NRBC 0.00 x10 3/uL               Procedures    Clinical Impression & Disposition     Clinical Impression  Final diagnoses:   Shortness of breath   Mild intermittent asthma with  acute exacerbation        ED Disposition     ED Disposition Condition Date/Time Comment    Discharge  Tue Feb 09, 2019 11:22 PM [name] discharge to home/self care.    Condition at disposition: Stable           Discharge Medication List as of 02/09/2019 11:22 PM      START taking these medications    Details   predniSONE (DELTASONE) 20 MG tablet Take 2 tablets (40 mg total) by mouth daily for 5 days, Starting Tue 02/09/2019, Until Sun 02/14/2019, E-Rx                       Leticia Clas, MD  02/11/19 0157

## 2019-02-09 NOTE — Telephone Encounter (Signed)
Patient's pharmacy called and is asking for 90 day supply instead of 30 day supply since it is not covered by insurance. Please advise.

## 2019-02-09 NOTE — Telephone Encounter (Signed)
90 day sent 

## 2019-02-10 ENCOUNTER — Telehealth (INDEPENDENT_AMBULATORY_CARE_PROVIDER_SITE_OTHER): Payer: Self-pay | Admitting: Family

## 2019-02-10 LAB — ECG 12-LEAD
Atrial Rate: 71 {beats}/min
Atrial Rate: 75 {beats}/min
Atrial Rate: 79 {beats}/min
P Axis: 113 degrees
P Axis: 51 degrees
P Axis: 57 degrees
P-R Interval: 146 ms
P-R Interval: 156 ms
P-R Interval: 158 ms
Q-T Interval: 372 ms
Q-T Interval: 374 ms
Q-T Interval: 376 ms
QRS Duration: 74 ms
QRS Duration: 78 ms
QRS Duration: 78 ms
QTC Calculation (Bezet): 408 ms
QTC Calculation (Bezet): 415 ms
QTC Calculation (Bezet): 428 ms
R Axis: 115 degrees
R Axis: 54 degrees
R Axis: 55 degrees
T Axis: 135 degrees
T Axis: 40 degrees
T Axis: 41 degrees
Ventricular Rate: 71 {beats}/min
Ventricular Rate: 75 {beats}/min
Ventricular Rate: 79 {beats}/min

## 2019-02-10 NOTE — Telephone Encounter (Signed)
Pt seen in ER - can you please call her sometime today or tomorrow and find out how she is feeling? Thank you!

## 2019-02-11 LAB — ECG 12-LEAD
Atrial Rate: 80 {beats}/min
P Axis: 109 degrees
P-R Interval: 134 ms
Q-T Interval: 372 ms
QRS Duration: 72 ms
QTC Calculation (Bezet): 429 ms
R Axis: 115 degrees
T Axis: 136 degrees
Ventricular Rate: 80 {beats}/min

## 2019-02-11 LAB — COVID-19 (SARS-COV-2): SARS CoV 2 Overall Result: NOT DETECTED

## 2019-02-11 NOTE — Telephone Encounter (Signed)
Thanks for calling her and for the update!

## 2019-02-11 NOTE — Telephone Encounter (Signed)
Spoke with pt. Went to ER 1/12 for severe shortness of breath. Pt has history of asthma. ER did CXR and covid test. COVID negative. ER sent her home w/ prednisone. Just started taking prednisone today and is starting to feel a little better. Pt feels stable at this time and is not as short of breath as she was then. Pt scheduled VV for follow-up next Wednesday 1/20. Instructed pt to call us or return to the ER if her shortness of breath worsens again. Pt expressed understanding.

## 2019-02-17 ENCOUNTER — Encounter (INDEPENDENT_AMBULATORY_CARE_PROVIDER_SITE_OTHER): Payer: Self-pay | Admitting: Family

## 2019-02-17 ENCOUNTER — Telehealth (INDEPENDENT_AMBULATORY_CARE_PROVIDER_SITE_OTHER): Payer: Commercial Managed Care - POS | Admitting: Family

## 2019-02-17 DIAGNOSIS — L309 Dermatitis, unspecified: Secondary | ICD-10-CM | POA: Insufficient documentation

## 2019-02-17 DIAGNOSIS — K219 Gastro-esophageal reflux disease without esophagitis: Secondary | ICD-10-CM

## 2019-02-17 DIAGNOSIS — H1045 Other chronic allergic conjunctivitis: Secondary | ICD-10-CM | POA: Insufficient documentation

## 2019-02-17 DIAGNOSIS — F418 Other specified anxiety disorders: Secondary | ICD-10-CM

## 2019-02-17 DIAGNOSIS — J4520 Mild intermittent asthma, uncomplicated: Secondary | ICD-10-CM

## 2019-02-17 NOTE — Progress Notes (Signed)
[facility] [place] [facility]                       Date of Exam: 02/17/2019 3:47 PM        Patient ID: Tina Daugherty is a 24 y.o. female.  Attending Physician: Carlos Levering, NP        Chief Complaint:    Chief Complaint   Patient presents with    ER Follow-up               HPI:    Verbal consent has been obtained from the patient to conduct this video visit encounter and to bill insurance to minimize exposure to COVID-19. Patient was positively identified and was verified to be located in the state of [place] at the time of the visit.    Patient presents for ER follow-up  - Went to ER 1/12 for severe shortness of breath.   - Pt has history of asthma.   - ER did CXR, EKG and covid test. COVID negative, CXR And EKG normal.  - ER sent her home w/ prednisone   - Pt took prednisone, which helped, stopped 1/17  - Pt reports shortness of breath is still intermittent, but not as bad  - Pt reports she sometimes uses albuterol inhaler when short of breath   - Plans to see pulm 2/9    Anxiety/depression   - Increased effexor to 75 mg one week ago (did not start taking increased dose until then)  - Reports she has not noticed much improvement yet  - Feels more tired    GERD  - Started prilosec 20 mg two weeks ago  - Gets burning feeling about every other day  - Most of foods she is eating is cereal, is all she wants to eat  - Has noticed slight improvement since starting med            Problem List:    Patient Active Problem List   Diagnosis    Corneal ulcer    Allergic rhinitis due to pollen    Asthma    Disorder of refraction and accommodation    Exercise-induced bronchospasm    Myopia    Adverse reaction to food    IUD (intrauterine device) in place    Eczema    Depression    Chronic allergic conjunctivitis             Current Meds:    Outpatient Medications Marked as Taking for the 02/17/19 encounter (Telemedicine Visit) with Carlos Levering, NP   Medication Sig Dispense Refill     ALBUTEROL SULFATE HFA IN Inhale 2 puffs into the lungs as needed.      busPIRone (BUSPAR) 7.5 MG tablet Take 7.5 mg by mouth as needed         cetirizine (ZyrTEC) 10 MG tablet Take 1 tablet (10 mg total) by mouth daily 90 tablet 3    EPINEPHrine 0.3 MG/0.3ML Solution Auto-injector injection 0.3 mg once         fluticasone (FLONASE) 50 MCG/ACT nasal spray 2 sprays by Nasal route daily         levonorgestrel (Mirena, 52 MG,) 20 MCG/24HR IUD 1 each by Intrauterine route once         omeprazole (PriLOSEC) 20 MG capsule Take 1 capsule (20 mg total) by mouth daily 30 capsule 0    venlafaxine (Effexor XR) 75 MG 24 hr capsule Take 1 capsule (75  mg total) by mouth daily 90 capsule 0          Allergies:    Allergies   Allergen Reactions    Peanut (Diagnostic) Anaphylaxis, Anxiety, Hives, Nausea Only, Shortness Of Breath and Wheezing             Past Surgical History:    Past Surgical History:   Procedure Laterality Date    ANTERIOR CRUCIATE LIGAMENT REPAIR  02/10/2015    ACL Reconstruction    left acl             Family History:    Family History   Problem Relation Age of Onset    Hypertension Father     Diabetes Maternal Grandmother     Diabetes Maternal Grandfather     Seizures Paternal Grandmother     Other Paternal Grandmother         family history of stroke           Social History:    Social History     Tobacco Use    Smoking status: Never Smoker    Smokeless tobacco: Never Used   Substance Use Topics    Alcohol use: Yes     Alcohol/week: 4.0 standard drinks     Types: 4 Glasses of wine per week    Drug use: No          The following sections were reviewed this encounter by the provider:   Tobacco   Allergies   Meds   Problems   Med Hx   Surg Hx   Fam Hx              Vital Signs:    LMP 01/20/2019          ROS:    Review of Systems   Constitutional: Negative for chills, fatigue and fever.   HENT: Negative for congestion, ear pain, rhinorrhea, sinus pressure, sinus pain and sore throat.    Eyes:  Negative for pain and discharge.   Respiratory: Negative for cough, chest tightness, shortness of breath and wheezing.    Cardiovascular: Negative for chest pain.   Gastrointestinal: Negative for diarrhea, nausea and vomiting.   Musculoskeletal: Negative for arthralgias and joint swelling.   Skin: Negative for rash.   Neurological: Negative for dizziness, weakness and headaches.              Physical Exam:    Physical Exam  Constitutional:       Appearance: Normal appearance.   HENT:      Head: Normocephalic.   Pulmonary:      Effort: Pulmonary effort is normal.   Neurological:      Mental Status: She is alert.              Assessment/Plan:    1. Mild intermittent asthma without complication  - Seen at ER for asthma exacerbation  - Reviewed notes  - Improved after course of prednisone  - Has not needed albuterol since stopping prednisone 3 days ago  - Reviewed step wise approach, advised to follow up if increasing use of albuterol (2-3x/week)  - Will follow up with pulm on 2/9    2. Mixed anxiety and depressive disorder  - Advised to give medication adjustment another 1-2 weeks for making any changes  - Monitor for worsening symptoms and contact office if these develop  - Is scheduled for follow up in 2 weeks    3. Gastroesophageal reflux disease, unspecified whether esophagitis present  -  Mild improvement on prilosec  - Cont to monitor for triggers and cont prilosec  - Will re-eval at follow up in 2 weeks, if persists may consider GI referral                Follow-up:    No follow-ups on file.         Carlos Levering, NP

## 2019-02-28 ENCOUNTER — Other Ambulatory Visit (INDEPENDENT_AMBULATORY_CARE_PROVIDER_SITE_OTHER): Payer: Self-pay | Admitting: Family

## 2019-02-28 DIAGNOSIS — K219 Gastro-esophageal reflux disease without esophagitis: Secondary | ICD-10-CM

## 2019-03-02 ENCOUNTER — Encounter (INDEPENDENT_AMBULATORY_CARE_PROVIDER_SITE_OTHER): Payer: Self-pay | Admitting: Family

## 2019-03-02 ENCOUNTER — Ambulatory Visit (INDEPENDENT_AMBULATORY_CARE_PROVIDER_SITE_OTHER): Payer: Commercial Managed Care - POS | Admitting: Family

## 2019-03-02 VITALS — BP 114/80 | HR 100 | Temp 98.0°F | Wt 168.0 lb

## 2019-03-02 DIAGNOSIS — K5900 Constipation, unspecified: Secondary | ICD-10-CM

## 2019-03-02 DIAGNOSIS — K219 Gastro-esophageal reflux disease without esophagitis: Secondary | ICD-10-CM

## 2019-03-02 DIAGNOSIS — J4520 Mild intermittent asthma, uncomplicated: Secondary | ICD-10-CM

## 2019-03-02 DIAGNOSIS — F418 Other specified anxiety disorders: Secondary | ICD-10-CM

## 2019-03-02 MED ORDER — PANTOPRAZOLE SODIUM 40 MG PO TBEC
40.00 mg | DELAYED_RELEASE_TABLET | Freq: Every day | ORAL | 0 refills | Status: DC
Start: 2019-03-02 — End: 2019-04-14

## 2019-03-02 MED ORDER — VENLAFAXINE HCL ER 75 MG PO CP24
75.00 mg | ORAL_CAPSULE | Freq: Every day | ORAL | 1 refills | Status: DC
Start: 2019-03-02 — End: 2019-09-01

## 2019-03-02 NOTE — Progress Notes (Signed)
[facility] [place] [facility]                       Date of Exam: 03/02/2019 2:30 PM        Patient ID: Tina Daugherty is a 24 y.o. female.  Attending Physician: Carlos Levering, NP        Chief Complaint:    Chief Complaint   Patient presents with    Asthma    Depression    Gastroesophageal Reflux               HPI:    Patient presents for follow-up of anxiety/depression, GERD, and Asthma.    Anxiety/Depression  - Currently taking Effexor 75 mg daily (increased dosage a couple of weeks ago)  - Feels that symptoms are well controlled on current dose of medication.  - Denies side effects, except for weird dreams   - Denies self-harm, SI/HI  - Sleeping schedule is not good - works overnight, goes to school during the day  - No changes in appetite     GERD  - Has been taking prilosec 20 mg for about a month  - Reports burning pain has remained the same.  - Feels like throat is tight and burns "a lot"  - Pt reports avoiding spicy, acidic foods   - Pt reports she also has excessive flatulence and had green stool x3 days without eating any green foods  - Increased feeling like she is not fully relieving herself with bowel movements  - Denies chest pain, shortness of breath, palpitations  - Was previously taking fiber, plans to restart  - Normal CBC, CMP 02/09/19 at ER    Asthma  - Will be seeing pulmonologist 2/9  - Has not needed to use albuterol inhaler  - Reports wheezing/shortness of breath has improved.            Problem List:    Patient Active Problem List   Diagnosis    Corneal ulcer    Allergic rhinitis due to pollen    Asthma    Disorder of refraction and accommodation    Exercise-induced bronchospasm    Myopia    Adverse reaction to food    IUD (intrauterine device) in place    Eczema    Depression    Chronic allergic conjunctivitis             Current Meds:    Outpatient Medications Marked as Taking for the 03/02/19 encounter (Office Visit) with Carlos Levering, NP    Medication Sig Dispense Refill    ALBUTEROL SULFATE HFA IN Inhale 2 puffs into the lungs as needed.      busPIRone (BUSPAR) 7.5 MG tablet Take 7.5 mg by mouth as needed         cetirizine (ZyrTEC) 10 MG tablet Take 1 tablet (10 mg total) by mouth daily 90 tablet 3    EPINEPHrine 0.3 MG/0.3ML Solution Auto-injector injection 0.3 mg once         fluticasone (FLONASE) 50 MCG/ACT nasal spray 2 sprays by Nasal route daily         levonorgestrel (Mirena, 52 MG,) 20 MCG/24HR IUD 1 each by Intrauterine route once         venlafaxine (Effexor XR) 75 MG 24 hr capsule Take 1 capsule (75 mg total) by mouth daily 90 capsule 1    [DISCONTINUED] omeprazole (PriLOSEC) 20 MG capsule TAKE 1 CAPSULE BY MOUTH EVERY DAY 30  capsule 0    [DISCONTINUED] venlafaxine (Effexor XR) 75 MG 24 hr capsule Take 1 capsule (75 mg total) by mouth daily 90 capsule 0          Allergies:    Allergies   Allergen Reactions    Peanut (Diagnostic) Anaphylaxis, Anxiety, Hives, Nausea Only, Shortness Of Breath and Wheezing             Past Surgical History:    Past Surgical History:   Procedure Laterality Date    ANTERIOR CRUCIATE LIGAMENT REPAIR  02/10/2015    ACL Reconstruction    left acl             Family History:    Family History   Problem Relation Age of Onset    Hypertension Father     Diabetes Maternal Grandmother     Diabetes Maternal Grandfather     Seizures Paternal Grandmother     Other Paternal Grandmother         family history of stroke           Social History:    Social History     Tobacco Use    Smoking status: Never Smoker    Smokeless tobacco: Never Used   Substance Use Topics    Alcohol use: Yes     Alcohol/week: 4.0 standard drinks     Types: 4 Glasses of wine per week    Drug use: No          The following sections were reviewed this encounter by the provider:            Vital Signs:    BP 114/80 (BP Site: Left arm, Patient Position: Sitting, Cuff Size: Medium)    Pulse 100    Temp 98 F (36.7 C) (Temporal)    Wt  76.2 kg (168 lb)    LMP 02/19/2019 (Exact Date)    SpO2 99%    BMI 24.11 kg/m          ROS:    Review of Systems   Constitutional: Negative for chills, fatigue and fever.   HENT: Negative for congestion, ear pain, rhinorrhea, sinus pressure, sinus pain and sore throat.    Eyes: Negative for pain and discharge.   Respiratory: Negative for cough, chest tightness, shortness of breath and wheezing.    Cardiovascular: Negative for chest pain.   Gastrointestinal: Positive for constipation. Negative for diarrhea, nausea and vomiting.   Musculoskeletal: Negative for arthralgias and joint swelling.   Skin: Negative for rash.   Neurological: Negative for dizziness, weakness and headaches.              Physical Exam:    Physical Exam  Constitutional:       Appearance: Normal appearance. She is normal weight.   HENT:      Head: Normocephalic.   Eyes:      Pupils: Pupils are equal, round, and reactive to light.   Neck:      Musculoskeletal: Neck supple.   Cardiovascular:      Rate and Rhythm: Normal rate and regular rhythm.   Pulmonary:      Effort: Pulmonary effort is normal.      Breath sounds: Normal breath sounds.   Skin:     General: Skin is warm and dry.   Neurological:      Mental Status: She is alert and oriented to person, place, and time.   Psychiatric:         Mood  and Affect: Mood normal.              Assessment/Plan:    1. Gastroesophageal reflux disease, unspecified whether esophagitis present  - Ambulatory referral to Gastroenterology  - No improvement on omeprazole, will do trial of pantoprazole  - No red flags at this time - monitor for  - Continue low acid diet  - Hopeful symptoms may also improve if she comes off night shift in future  - Recommend GI consult given persistent symptoms    2. Mixed anxiety and depressive disorder  - venlafaxine (Effexor XR) 75 MG 24 hr capsule; Take 1 capsule (75 mg total) by mouth daily  Dispense: 90 capsule; Refill: 1  - Improvement after increase to 75 mg dose  - Taking daily  without side effects  - Follow up 6 months    3. Constipation, unspecified constipation type  - Discussed importance of daily fiber, adequate hydration  - Recommend she continue food diary  - Check TSH today  - Will also plan to discuss with GI per referral above    4.  Asthma  - Has not needed inhaler in recent weeks  - Has f/u with pulm 2/9                Follow-up:    Return in about 6 months (around 08/30/2019).         Carlos Levering, NP

## 2019-03-03 LAB — TSH: TSH: 1.32 u[IU]/mL (ref 0.450–4.500)

## 2019-03-22 ENCOUNTER — Ambulatory Visit (INDEPENDENT_AMBULATORY_CARE_PROVIDER_SITE_OTHER): Payer: Commercial Managed Care - POS | Admitting: Family

## 2019-03-22 ENCOUNTER — Encounter (INDEPENDENT_AMBULATORY_CARE_PROVIDER_SITE_OTHER): Payer: Self-pay | Admitting: Family

## 2019-03-22 ENCOUNTER — Ambulatory Visit (INDEPENDENT_AMBULATORY_CARE_PROVIDER_SITE_OTHER): Payer: Commercial Managed Care - POS | Admitting: Family Medicine

## 2019-03-22 VITALS — BP 112/76 | HR 84 | Temp 97.8°F | Wt 159.0 lb

## 2019-03-22 DIAGNOSIS — N760 Acute vaginitis: Secondary | ICD-10-CM

## 2019-03-22 DIAGNOSIS — Z111 Encounter for screening for respiratory tuberculosis: Secondary | ICD-10-CM

## 2019-03-22 NOTE — Progress Notes (Signed)
[facility] [place] [facility]                       Date of Exam: 03/22/2019 3:13 PM        Patient ID: Tina Daugherty is a 24 y.o. female.  Attending Physician: Laurence Compton, FNP        Chief Complaint:    Chief Complaint   Patient presents with    Vaginitis               HPI:    Pt presents today with yeast infection    1.Vaginitis   . Vaginal Itching and discharge   - Started 2 weeks ago   - Symptoms include itchy and uncomfortable   - Pt states she is having yellow discharge that smells and abdominal pain  - Denies urgency, frequency, hematuria, fever nausea and vomiting   - Hasn't tried anything OTC   - Does have a hx of frequent yeast infections and pt states after her IUD number of infections have increased   - No concern for STI   - Reports symptoms have improved significantly since starting her period   - Otherwise feels well               Problem List:    Patient Active Problem List   Diagnosis    Corneal ulcer    Allergic rhinitis due to pollen    Asthma    Disorder of refraction and accommodation    Exercise-induced bronchospasm    Myopia    Adverse reaction to food    IUD (intrauterine device) in place    Eczema    Depression    Chronic allergic conjunctivitis             Current Meds:    Outpatient Medications Marked as Taking for the 03/22/19 encounter (Office Visit) with Laurence Compton, FNP   Medication Sig Dispense Refill    ALBUTEROL SULFATE HFA IN Inhale 2 puffs into the lungs as needed.      busPIRone (BUSPAR) 7.5 MG tablet Take 7.5 mg by mouth as needed         cetirizine (ZyrTEC) 10 MG tablet Take 1 tablet (10 mg total) by mouth daily 90 tablet 3    EPINEPHrine 0.3 MG/0.3ML Solution Auto-injector injection 0.3 mg once         fluticasone (FLONASE) 50 MCG/ACT nasal spray 2 sprays by Nasal route daily         levonorgestrel (Mirena, 52 MG,) 20 MCG/24HR IUD 1 each by Intrauterine route once         pantoprazole (PROTONIX) 40 MG tablet Take 1  tablet (40 mg total) by mouth daily 30 tablet 0    venlafaxine (Effexor XR) 75 MG 24 hr capsule Take 1 capsule (75 mg total) by mouth daily 90 capsule 1          Allergies:    Allergies   Allergen Reactions    Peanut (Diagnostic) Anaphylaxis, Anxiety, Hives, Nausea Only, Shortness Of Breath and Wheezing             Past Surgical History:    Past Surgical History:   Procedure Laterality Date    ANTERIOR CRUCIATE LIGAMENT REPAIR  02/10/2015    ACL Reconstruction    left acl             Family History:    Family History   Problem Relation Age of Onset  Hypertension Father     Diabetes Maternal Grandmother     Diabetes Maternal Grandfather     Seizures Paternal Grandmother     Other Paternal Grandmother         family history of stroke           Social History:    Social History     Tobacco Use    Smoking status: Never Smoker    Smokeless tobacco: Never Used   Substance Use Topics    Alcohol use: Yes     Alcohol/week: 4.0 standard drinks     Types: 4 Glasses of wine per week    Drug use: No          The following sections were reviewed this encounter by the provider:            Vital Signs:    BP 112/76 (BP Site: Right arm, Patient Position: Sitting, Cuff Size: Small)    Pulse 84    Temp 97.8 F (36.6 C) (Temporal)    Wt 72.1 kg (159 lb)    LMP 03/15/2019 (Exact Date)    SpO2 99%    BMI 22.81 kg/m          ROS:    Review of Systems   Constitutional: Negative for chills, diaphoresis, fatigue and unexpected weight change.   Respiratory: Negative for cough and chest tightness.    Cardiovascular: Negative for chest pain.   Gastrointestinal: Negative for abdominal distention, abdominal pain and nausea.   Genitourinary: Positive for vaginal discharge. Negative for dysuria, frequency, genital sores, hematuria, menstrual problem, pelvic pain, urgency and vaginal bleeding.   All other systems reviewed and are negative.             Physical Exam:    Physical Exam  Vitals signs and nursing note reviewed.    Constitutional:       Appearance: Normal appearance. She is normal weight.   Abdominal:      General: Abdomen is flat. There is no distension.      Tenderness: There is no abdominal tenderness. There is no right CVA tenderness, left CVA tenderness or guarding.   Genitourinary:     Exam position: Lithotomy position.      Vagina: No signs of injury. Vaginal discharge and erythema present. No tenderness or lesions.      Cervix: Discharge present.   Neurological:      Mental Status: She is alert.              Assessment/Plan:    1. Acute vaginitis  - DDx discussed to include candida vs BV.   - We will proceed with NuSwab prior to treatment as symptoms seem to have significantly improved   - Recommended wearing loose breathable clothing  - Discussed importance of skin hygiene  - Follow-up in 3-4 weeks if not improving    - NuSwab BV and Candida, NAA    2. 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         Laurence Compton, FNP

## 2019-03-24 ENCOUNTER — Telehealth (INDEPENDENT_AMBULATORY_CARE_PROVIDER_SITE_OTHER): Payer: Self-pay | Admitting: Family

## 2019-03-24 ENCOUNTER — Encounter (INDEPENDENT_AMBULATORY_CARE_PROVIDER_SITE_OTHER): Payer: Self-pay | Admitting: Family

## 2019-03-24 LAB — NUSWAB BV AND CANDIDA, NAA
Candida albicans, NAA: NEGATIVE
Candida glabrata, NAA: NEGATIVE

## 2019-03-24 NOTE — Telephone Encounter (Signed)
NuSwab results sent via [facility].

## 2019-03-25 LAB — QUANTIFERON(R)-TB GOLD PLUS
QuantiFERON Mitogen Value: >10 IU/mL
QuantiFERON Nil Value: 0.04 IU/mL
Quantiferon TB Gold Plus: NEGATIVE
Quantiferon TB1 Ag Value: 0.04 IU/mL
Quantiferon TB2 Ag Value: 0.03 IU/mL

## 2019-03-25 NOTE — Progress Notes (Signed)
TB screen negative. Pt advised via [facility].

## 2019-03-30 ENCOUNTER — Encounter: Payer: Managed Care, Other (non HMO) | Admitting: Nurse Practitioner

## 2019-04-02 ENCOUNTER — Encounter: Payer: Managed Care, Other (non HMO) | Admitting: Nurse Practitioner

## 2019-04-02 ENCOUNTER — Encounter (INDEPENDENT_AMBULATORY_CARE_PROVIDER_SITE_OTHER): Payer: Self-pay | Admitting: Family

## 2019-04-05 ENCOUNTER — Other Ambulatory Visit (INDEPENDENT_AMBULATORY_CARE_PROVIDER_SITE_OTHER): Payer: Self-pay | Admitting: Family

## 2019-04-06 NOTE — Telephone Encounter (Signed)
#  1 [id number]

## 2019-04-06 NOTE — Telephone Encounter (Signed)
Can you please call pt and find out if she is still taking this and how it's working for her?  We had said if she was still having GI upset may need to see GI so wanted to see how she is feeling currently, thanks!

## 2019-04-12 NOTE — Telephone Encounter (Signed)
#  2 [id number]

## 2019-04-14 NOTE — Telephone Encounter (Signed)
T/c with patient, who reports pantoprazole did help a little bit with the acid she was having, but she is still having issues with her stomach. Reports she will be following up with a GI specialist soon, but would like a refill of the pantoprazole at this time. Pt already has referral for GI specialist and will be making appointment. Confirmed with EW, ok to send in another 30 day supply to pharmacy.

## 2019-05-09 ENCOUNTER — Other Ambulatory Visit (INDEPENDENT_AMBULATORY_CARE_PROVIDER_SITE_OTHER): Payer: Self-pay | Admitting: Family

## 2019-08-30 NOTE — Progress Notes (Signed)
[facility] [place] [facility]                       Date of Exam: 09/01/2019 11:13 AM        Patient ID: Tina Daugherty is a 24 y.o. female.  Attending Physician: Carlos Levering, NP        Chief Complaint:    Chief Complaint   Patient presents with    Depression    Anxiety               HPI:    Patient presents for follow-up of Anxiety and Depression  - Currently taking Effexor XR 75mg  daily  - Denies chest pain, SOB, insomnia, palpitations, dry mouth, weight gain, and weight loss, fatigue and lack of concentration   - Denies obsessions, confusion, racing thoughts, self-harm, SI/HI  - Sleeps well -  4-7 hours per night d/t going to bed late and having to get up at 445 am for work  - "A little change in my appetite. 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      cetirizine (ZyrTEC) 10 MG tablet Take 1 tablet (10 mg total) by mouth daily 90 tablet 3    EPINEPHrine 0.3  MG/0.3ML Solution Auto-injector injection 0.3 mg once         fluticasone (FLONASE) 50 MCG/ACT nasal spray 2 sprays by Nasal route daily         levonorgestrel (Mirena, 52 MG,) 20 MCG/24HR IUD 1 each by Intrauterine route once         venlafaxine (Effexor XR) 75 MG 24 hr capsule Take 1 capsule (75 mg total) by mouth daily 90 capsule 1    [DISCONTINUED] venlafaxine (Effexor XR) 75 MG 24 hr capsule Take 1 capsule (75 mg total) by mouth daily 90 capsule 1          Allergies:    Allergies   Allergen Reactions    Peanut (Diagnostic) Anaphylaxis, Anxiety, Hives, Nausea Only, Shortness Of Breath and Wheezing    Mixed Grasses     Other  All nuts. And cat dander      Pollen Extract              Past Surgical History:    Past Surgical History:   Procedure Laterality Date    ANTERIOR CRUCIATE LIGAMENT REPAIR  02/10/2015    ACL Reconstruction    left acl             Family History:    Family History   Problem Relation Age of Onset    Hypertension Father     Diabetes Maternal Grandmother     Diabetes Maternal Grandfather     Seizures Paternal Grandmother     Other Paternal Grandmother         family history of stroke           Social History:    Social History     Tobacco Use    Smoking status: Never Smoker    Smokeless tobacco: Never Used   Haematologist Use: Never used   Substance Use Topics    Alcohol use: Yes     Alcohol/week: 4.0 standard drinks     Types: 4 Glasses of wine per week    Drug use: Never          The following sections were reviewed this encounter by the provider:   Tobacco   Allergies   Meds   Problems   Med Hx   Surg Hx   Fam Hx              Vital Signs:    BP (!) 134/92 (BP Site: Left arm, Patient Position: Sitting, Cuff Size: Medium)    Pulse 80    Temp 97.5 F (36.4 C) (Tympanic)    Wt 78.9 kg (174 lb)    LMP 08/19/2019 (Exact Date)    BMI 24.97 kg/m          ROS:    Review of Systems   Constitutional: Negative for activity change, appetite change, fatigue and unexpected  weight change.   Respiratory: Negative for chest tightness and shortness of breath.    Cardiovascular: Negative for chest pain and palpitations.   Psychiatric/Behavioral: Negative for dysphoric mood and sleep disturbance. The patient is not nervous/anxious.               Physical Exam:    Physical Exam  Vitals and nursing note reviewed.   Constitutional:       Appearance: Normal appearance. She is normal weight.   HENT:      Head: Normocephalic.   Eyes:      Pupils: Pupils are equal, round, and reactive to light.   Cardiovascular:      Rate and Rhythm: Normal rate and regular rhythm.   Pulmonary:      Effort: Pulmonary effort is normal.      Breath sounds: Normal breath sounds.   Musculoskeletal:      Cervical back: Neck supple.   Skin:     General: Skin is warm and dry.   Neurological:      Mental Status: She is alert and oriented to person, place, and time.   Psychiatric:         Mood and Affect: Mood normal.              Assessment/Plan:    1. Mixed anxiety and depressive disorder  - venlafaxine (Effexor XR) 75 MG 24 hr capsule; Take 1 capsule (75 mg total) by mouth  daily  Dispense: 90 capsule; Refill: 1  - Happy with current medication and dosage  - Continue effexor 75 mg daily  - Do not stop abruptly  - Continue efforts to reduce stress, anxiety  - Follow up around 6 months - when she is home from school                Follow-up:    Return in about 6 months (around 03/03/2020).         Carlos Levering, NP

## 2019-09-01 ENCOUNTER — Ambulatory Visit (INDEPENDENT_AMBULATORY_CARE_PROVIDER_SITE_OTHER): Payer: Commercial Managed Care - POS | Admitting: Family

## 2019-09-01 ENCOUNTER — Encounter (INDEPENDENT_AMBULATORY_CARE_PROVIDER_SITE_OTHER): Payer: Self-pay | Admitting: Family

## 2019-09-01 VITALS — BP 134/92 | HR 80 | Temp 97.5°F | Wt 174.0 lb

## 2019-09-01 DIAGNOSIS — F418 Other specified anxiety disorders: Secondary | ICD-10-CM

## 2019-09-01 MED ORDER — VENLAFAXINE HCL ER 75 MG PO CP24
75.00 mg | ORAL_CAPSULE | Freq: Every day | ORAL | 1 refills | Status: DC
Start: 2019-09-01 — End: 2021-09-27

## 2019-09-14 ENCOUNTER — Encounter: Payer: Managed Care, Other (non HMO) | Admitting: Nurse Practitioner

## 2019-10-19 ENCOUNTER — Encounter: Payer: Self-pay | Admitting: Nurse Practitioner

## 2019-10-19 ENCOUNTER — Other Ambulatory Visit: Payer: Self-pay

## 2019-10-19 ENCOUNTER — Ambulatory Visit (INDEPENDENT_AMBULATORY_CARE_PROVIDER_SITE_OTHER): Payer: Managed Care, Other (non HMO) | Admitting: Nurse Practitioner

## 2019-10-19 VITALS — BP 114/86 | HR 80 | Temp 97.8°F | Ht 69.0 in | Wt 174.0 lb

## 2019-10-19 DIAGNOSIS — Z0001 Encounter for general adult medical examination with abnormal findings: Secondary | ICD-10-CM

## 2019-10-19 DIAGNOSIS — F331 Major depressive disorder, recurrent, moderate: Secondary | ICD-10-CM | POA: Diagnosis not present

## 2019-10-19 DIAGNOSIS — F419 Anxiety disorder, unspecified: Secondary | ICD-10-CM | POA: Insufficient documentation

## 2019-10-19 DIAGNOSIS — Z23 Encounter for immunization: Secondary | ICD-10-CM | POA: Diagnosis not present

## 2019-10-19 LAB — COMPREHENSIVE METABOLIC PANEL
ALT: 9 U/L (ref 0–35)
AST: 15 U/L (ref 0–37)
Albumin: 4.6 g/dL (ref 3.5–5.2)
Alkaline Phosphatase: 33 U/L — ABNORMAL LOW (ref 39–117)
BUN: 9 mg/dL (ref 6–23)
CO2: 26 mEq/L (ref 19–32)
Calcium: 9.4 mg/dL (ref 8.4–10.5)
Chloride: 104 mEq/L (ref 96–112)
Creatinine, Ser: 0.81 mg/dL (ref 0.40–1.20)
GFR: 106.85 mL/min (ref >60.00)
Glucose, Bld: 82 mg/dL (ref 70–99)
Potassium: 4.2 mEq/L (ref 3.5–5.1)
Sodium: 137 mEq/L (ref 135–145)
Total Bilirubin: 0.9 mg/dL (ref 0.2–1.2)
Total Protein: 7.2 g/dL (ref 6.0–8.3)

## 2019-10-19 LAB — CBC WITH DIFFERENTIAL/PLATELET
Basophils Absolute: 0 10*3/uL (ref 0.0–0.1)
Basophils Relative: 0.5 % (ref 0.0–3.0)
Eosinophils Absolute: 0 10*3/uL (ref 0.0–0.7)
Eosinophils Relative: 0 % (ref 0.0–5.0)
HCT: 40.9 % (ref 36.0–46.0)
Hemoglobin: 13.5 g/dL (ref 12.0–15.0)
Lymphocytes Relative: 31.7 % (ref 12.0–46.0)
Lymphs Abs: 1.5 10*3/uL (ref 0.7–4.0)
MCHC: 33 g/dL (ref 30.0–36.0)
MCV: 86.4 fl (ref 78.0–100.0)
Monocytes Absolute: 0.3 10*3/uL (ref 0.1–1.0)
Monocytes Relative: 6.4 % (ref 3.0–12.0)
Neutro Abs: 2.9 10*3/uL (ref 1.4–7.7)
Neutrophils Relative %: 61.4 % (ref 43.0–77.0)
Platelets: 236 10*3/uL (ref 150.0–400.0)
RBC: 4.74 Mil/uL (ref 3.87–5.11)
RDW: 13.3 % (ref 11.5–15.5)
WBC: 4.7 10*3/uL (ref 4.0–10.5)

## 2019-10-19 LAB — TSH: TSH: 1.05 u[IU]/mL (ref 0.35–4.50)

## 2019-10-19 NOTE — Patient Instructions (Addendum)
Wish you the best with school. Stay safe  Go to lab for blood draw  Please sign medical release form to get PAP results from your GYN.  You will be contacted to schedule appt with therapist  Health Maintenance, Female Adopting a healthy lifestyle and getting preventive care are important in promoting health and wellness. Ask your health care provider about:  The right schedule for you to have regular tests and exams.  Things you can do on your own to prevent diseases and keep yourself healthy. What should I know about diet, weight, and exercise? Eat a healthy diet   Eat a diet that includes plenty of vegetables, fruits, low-fat dairy products, and lean protein.  Do not eat a lot of foods that are high in solid fats, added sugars, or sodium. Maintain a healthy weight Body mass index (BMI) is used to identify weight problems. It estimates body fat based on height and weight. Your health care provider can help determine your BMI and help you achieve or maintain a healthy weight. Get regular exercise Get regular exercise. This is one of the most important things you can do for your health. Most adults should:  Exercise for at least 150 minutes each week. The exercise should increase your heart rate and make you sweat (moderate-intensity exercise).  Do strengthening exercises at least twice a week. This is in addition to the moderate-intensity exercise.  Spend less time sitting. Even light physical activity can be beneficial. Watch cholesterol and blood lipids Have your blood tested for lipids and cholesterol at 22 years of age, then have this test every 5 years. Have your cholesterol levels checked more often if:  Your lipid or cholesterol levels are high.  You are older than 22 years of age.  You are at high risk for heart disease. What should I know about cancer screening? Depending on your health history and family history, you may need to have cancer screening at various  ages. This may include screening for:  Breast cancer.  Cervical cancer.  Colorectal cancer.  Skin cancer.  Lung cancer. What should I know about heart disease, diabetes, and high blood pressure? Blood pressure and heart disease  High blood pressure causes heart disease and increases the risk of stroke. This is more likely to develop in people who have high blood pressure readings, are of African descent, or are overweight.  Have your blood pressure checked: ? Every 3-5 years if you are 27-29 years of age. ? Every year if you are 62 years old or older. Diabetes Have regular diabetes screenings. This checks your fasting blood sugar level. Have the screening done:  Once every three years after age 58 if you are at a normal weight and have a low risk for diabetes.  More often and at a younger age if you are overweight or have a high risk for diabetes. What should I know about preventing infection? Hepatitis B If you have a higher risk for hepatitis B, you should be screened for this virus. Talk with your health care provider to find out if you are at risk for hepatitis B infection. Hepatitis C Testing is recommended for:  Everyone born from [birth year] through 1965.  Anyone with known risk factors for hepatitis C. Sexually transmitted infections (STIs)  Get screened for STIs, including gonorrhea and chlamydia, if: ? You are sexually active and are younger than 22 years of age. ? You are older than 22 years of age and your health care provider  tells you that you are at risk for this type of infection. ? Your sexual activity has changed since you were last screened, and you are at increased risk for chlamydia or gonorrhea. Ask your health care provider if you are at risk.  Ask your health care provider about whether you are at high risk for HIV. Your health care provider may recommend a prescription medicine to help prevent HIV infection. If you choose to take medicine to prevent HIV, you  should first get tested for HIV. You should then be tested every 3 months for as long as you are taking the medicine. Pregnancy  If you are about to stop having your period (premenopausal) and you may become pregnant, seek counseling before you get pregnant.  Take 400 to 800 micrograms (mcg) of folic acid every day if you become pregnant.  Ask for birth control (contraception) if you want to prevent pregnancy. Osteoporosis and menopause Osteoporosis is a disease in which the bones lose minerals and strength with aging. This can result in bone fractures. If you are 9 years old or older, or if you are at risk for osteoporosis and fractures, ask your health care provider if you should:  Be screened for bone loss.  Take a calcium or vitamin D supplement to lower your risk of fractures.  Be given hormone replacement therapy (HRT) to treat symptoms of menopause. Follow these instructions at home: Lifestyle  Do not use any products that contain nicotine or tobacco, such as cigarettes, e-cigarettes, and chewing tobacco. If you need help quitting, ask your health care provider.  Do not use street drugs.  Do not share needles.  Ask your health care provider for help if you need support or information about quitting drugs. Alcohol use  Do not drink alcohol if: ? Your health care provider tells you not to drink. ? You are pregnant, may be pregnant, or are planning to become pregnant.  If you drink alcohol: ? Limit how much you use to 0-1 drink a day. ? Limit intake if you are breastfeeding.  Be aware of how much alcohol is in your drink. In the [place] one drink equals one 12 oz bottle of beer (355 mL), one 5 oz glass of wine (148 mL), or one 1 oz glass of hard liquor (44 mL). General instructions  Schedule regular health, dental, and eye exams.  Stay current with your vaccines.  Tell your health care provider if: ? You often feel depressed. ? You have ever been abused or do not feel  safe at home. Summary  Adopting a healthy lifestyle and getting preventive care are important in promoting health and wellness.  Follow your health care provider's instructions about healthy diet, exercising, and getting tested or screened for diseases.  Follow your health care provider's instructions on monitoring your cholesterol and blood pressure. This information is not intended to replace advice given to you by your health care provider. Make sure you discuss any questions you have with your health care provider. Document Revised: 01/07/2018 Document Reviewed: 01/07/2018 Elsevier Patient Education  2020 [employer].

## 2019-10-19 NOTE — Assessment & Plan Note (Signed)
Stable mood with increase effexor dose. Needs referral to therapist.referral entered Maintain current medication F/up in 24months or sooner if needed

## 2019-10-19 NOTE — Progress Notes (Signed)
Subjective:    Patient ID: Megan Elliott, female    DOB: Jul 12, 1995, 24 y.o.   MRN: [id number]  Patient presents today for CPE and eval of chronic conditions  HPI Depression Stable mood with increase effexor dose. Needs referral to therapist.referral entered Maintain current medication F/up in 21months or sooner if needed  Sexual History (orientation,birth control, marital status, STD):IUD in place, not sexually active, denies need for STD screen, up to date with PAP by GYN per patient  Depression/Suicide: Depression screen [facility] 2/9 10/19/2019 09/18/2018 03/25/2018 11/13/2017 11/13/2017  Decreased Interest 1 0 1 2 1   Down, Depressed, Hopeless 2 0 1 1 1   PHQ - 2 Score 3 0 2 3 2   Altered sleeping 3 1 0 1 -  Tired, decreased energy 1 1 2 2  -  Change in appetite 1 1 0 2 -  Feeling bad or failure about yourself  0 0 1 1 -  Trouble concentrating 0 0 1 3 -  Moving slowly or fidgety/restless 0 0 0 1 -  Suicidal thoughts 0 0 0 0 -  PHQ-9 Score 8 3 6 13  -  Difficult doing work/chores Somewhat difficult - - - -   GAD 7 : Generalized Anxiety Score 10/19/2019 09/18/2018 03/25/2018 11/13/2017  Nervous, Anxious, on Edge 2 1 1 3   Control/stop worrying 2 1 2 3   Worry too much - different things 3 0 1 3  Trouble relaxing 2 0 1 3  Restless 0 0 1 2  Easily annoyed or irritable 1 0 1 3  Afraid - awful might happen 0 0 0 2  Total GAD 7 Score 10 2 7 19   Anxiety Difficulty Very difficult - - -   Vision:up to date, use of corrective lens  Dental:up to date  Immunizations: (TDAP, Hep C screen, Pneumovax, Influenza, zoster)  Health Maintenance  Topic Date Due   Pap Smear  Never done   Pap Smear  Never done   Chlamydia screening  03/26/2019    Hepatitis C: One time screening is recommended by Center for Disease Control  (CDC) for  adults born from [birth year] through 1963.   10/18/2020*   Tetanus Vaccine  07/16/2025   Flu Shot  Completed   COVID-19 Vaccine  Completed   HIV Screening  Completed    *Topic was postponed. The date shown is not the original due date.   Diet:regular.  Weight:  Wt Readings from Last 3 Encounters:  10/19/19 174 lb (78.9 kg)  09/18/18 153 lb 6.4 oz (69.6 kg)  05/26/18 152 lb 12.8 oz (69.3 kg)    Fall Risk: Fall Risk  03/25/2018 11/13/2017  Falls in the past year? 0 No   Medications and allergies reviewed with patient and updated if appropriate.  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 30 tablet 0   levonorgestrel (MIRENA, 52 MG,) 20 MCG/24HR IUD 1 each by Intrauterine route once. Inserted 12/04/2017 by [facility] 1 each 0   venlafaxine (EFFEXOR) 75 MG tablet Take 75 mg by mouth 2 (two) times daily.     busPIRone (BUSPAR) 7.5 MG tablet Take 1 tablet (7.5 mg total) by mouth 3 (three) times daily as needed. 90 tablet 5   lactulose (CHRONULAC) 10 GM/15ML solution Take 30 mLs (20 g total) by mouth daily as needed for moderate constipation or severe constipation. 236 mL 1   Magnesium 250 MG TABS Take 1 tablet (250 mg total) by mouth daily.  0   No current facility-administered medications on file prior to visit.    Past Medical History:  Diagnosis Date   Asthma    Headache    UTI (urinary tract infection)     History  reviewed. No pertinent surgical history.  Social History   Socioeconomic History   Marital status: Single    Spouse name: Not on file   Number of children: 0   Years of education: Not on file   Highest education level: Not on file  Occupational History    Comment: Dietitian- Elementary Education  Tobacco Use   Smoking status: Never Smoker   Smokeless tobacco: Never Used  Building services engineer Use: Never used  Substance and Sexual Activity   Alcohol use: Yes    Comment: social   Drug use: Never   Sexual activity: Yes    Birth control/protection: Implant  Other Topics Concern   Not on file  Social History Narrative   Not on file   Social Determinants of Health   Financial Resource Strain:    Difficulty of Paying Living Expenses: Not on file  Food Insecurity:    Worried About Programme researcher, broadcasting/film/video in the Last Year: Not on file   [employer] of Food in the Last Year: Not on file  Transportation Needs:    Lack of Transportation (Medical): Not on file   Lack of Transportation (Non-Medical): Not on file  Physical Activity:    Days of Exercise per Week: Not on file   Minutes of Exercise per Session: Not on file  Stress:    Feeling of Stress : Not on file  Social Connections:    Frequency of Communication with Friends and Family: Not on file   Frequency of Social Gatherings with Friends and Family: Not on file   Attends Religious Services: Not on file   Active Member of Clubs or Organizations: Not on file   Attends Banker Meetings: Not on file   Marital Status: Not on file    Family History  Problem Relation Age of Onset   Hypertension Father    Diabetes Maternal Grandmother    Epilepsy Paternal Grandmother         Review of Systems  Constitutional: Negative for fever, malaise/fatigue and weight loss.  HENT: Negative for congestion and sore throat.   Eyes:       Negative for visual changes  Respiratory: Negative for cough  and shortness of breath.   Cardiovascular: Negative for chest pain, palpitations and leg swelling.  Gastrointestinal: Negative for blood in stool, constipation, diarrhea and heartburn.  Genitourinary: Negative for dysuria, frequency and urgency.  Musculoskeletal: Negative for falls, joint pain and myalgias.  Skin: Negative for rash.  Neurological: Negative for dizziness, sensory change and headaches.  Endo/Heme/Allergies: Does not  bruise/bleed easily.  Psychiatric/Behavioral: Positive for depression. Negative for substance abuse and suicidal ideas. The patient is nervous/anxious. The patient does not have insomnia.     Objective:   Vitals:   10/19/19 0913  BP: 114/86  Pulse: 80  Temp: 97.8 F (36.6 C)  SpO2: 100%    Body mass index is 25.7 kg/m.   Physical Examination:  Physical Exam Vitals and nursing note reviewed.  HENT:     Right Ear: Tympanic membrane, ear canal and external ear normal.     Left Ear: Tympanic membrane, ear canal and external ear normal.  Eyes:     General: No scleral icterus.    Extraocular Movements: Extraocular movements intact.     Conjunctiva/sclera: Conjunctivae normal.  Neck:     Thyroid: No thyromegaly.  Cardiovascular:     Rate and Rhythm: Normal rate.     Pulses: Normal pulses.     Heart sounds: Normal heart sounds.  Pulmonary:     Effort: Pulmonary effort is normal.     Breath sounds: Normal breath sounds.  Chest:     Chest wall: No tenderness.  Abdominal:     General: Bowel sounds are normal. There is no distension.     Palpations: Abdomen is soft.     Tenderness: There is no abdominal tenderness.  Genitourinary:    Comments: Deferred breast and pelvic exam to GYN Musculoskeletal:        General: No tenderness. Normal range of motion.     Cervical back: Normal range of motion and neck supple.  Lymphadenopathy:     Cervical: No cervical adenopathy.  Skin:    General: Skin is warm and dry.  Neurological:     Mental Status:  She is alert and oriented to person, place, and time.  Psychiatric:        Mood and Affect: Mood normal.        Behavior: Behavior normal.        Thought Content: Thought content normal.        Judgment: Judgment normal.    ASSESSMENT and PLAN: This visit occurred during the SARS-CoV-2 public health emergency.  Safety protocols were in place, including screening questions prior to the visit, additional usage of staff PPE, and extensive cleaning of exam room while observing appropriate contact time as indicated for disinfecting solutions.   Megan Elliott was seen today for annual exam.  Diagnoses and all orders for this visit:  Encounter for preventative adult health care exam with abnormal findings -     CBC with Differential/Platelet -     Comprehensive metabolic panel -     TSH  Influenza vaccine needed -     Flu Vaccine QUAD 6+ mos PF IM (Fluarix Quad PF)  Moderate episode of recurrent major depressive disorder (HCC) -     Ambulatory referral to Psychology      Problem List Items Addressed This Visit      Other   Depression    Stable mood with increase effexor dose. Needs referral to therapist.referral entered Maintain current medication F/up in 6months or sooner if needed      Relevant Medications   venlafaxine (EFFEXOR) 75 MG tablet   Other Relevant Orders   Ambulatory referral to Psychology    Other Visit Diagnoses    Encounter for preventative adult health care exam with abnormal findings    -  Primary   Relevant Orders   CBC with Differential/Platelet   Comprehensive metabolic panel   TSH  Influenza vaccine needed       Relevant Orders   Flu Vaccine QUAD 6+ mos PF IM (Fluarix Quad PF) (Completed)      Follow up: Return in about 6 months (around 04/17/2020) for anxiety and depression (video, ).  [name], NP

## 2019-10-27 ENCOUNTER — Other Ambulatory Visit: Payer: Self-pay | Admitting: Nurse Practitioner

## 2019-10-27 DIAGNOSIS — F341 Dysthymic disorder: Secondary | ICD-10-CM

## 2019-10-28 ENCOUNTER — Other Ambulatory Visit: Payer: Self-pay | Admitting: Nurse Practitioner

## 2019-10-28 DIAGNOSIS — F341 Dysthymic disorder: Secondary | ICD-10-CM

## 2019-10-28 NOTE — Telephone Encounter (Signed)
Received a refill request for:  Buspar 7.5 mg tabs.   LR 03/25/18, #90, 5 rf's LOV 10/19/19 FOV 04/18/20  Please review and advise.  Thanks.  Dm/cma

## 2019-11-16 ENCOUNTER — Other Ambulatory Visit: Payer: Self-pay | Admitting: Nurse Practitioner

## 2019-11-16 DIAGNOSIS — J302 Other seasonal allergic rhinitis: Secondary | ICD-10-CM

## 2019-11-16 NOTE — Telephone Encounter (Signed)
Higher dose not recommended. She can switch to another OTC antihistamine: allegra or xyzal or claritin

## 2019-11-16 NOTE — Telephone Encounter (Signed)
Pt requesting higher dose, stating she does not feel this medication is working as well as it use to. Please advise

## 2019-12-07 ENCOUNTER — Ambulatory Visit: Payer: Managed Care, Other (non HMO) | Admitting: Nurse Practitioner

## 2019-12-07 DIAGNOSIS — Z0289 Encounter for other administrative examinations: Secondary | ICD-10-CM

## 2019-12-09 ENCOUNTER — Encounter: Payer: Self-pay | Admitting: Nurse Practitioner

## 2019-12-09 ENCOUNTER — Other Ambulatory Visit: Payer: Self-pay

## 2019-12-09 ENCOUNTER — Ambulatory Visit (INDEPENDENT_AMBULATORY_CARE_PROVIDER_SITE_OTHER): Payer: Managed Care, Other (non HMO) | Admitting: Nurse Practitioner

## 2019-12-09 VITALS — BP 122/80 | HR 82 | Temp 97.3°F | Ht 69.0 in | Wt 175.0 lb

## 2019-12-09 DIAGNOSIS — J040 Acute laryngitis: Secondary | ICD-10-CM | POA: Diagnosis not present

## 2019-12-09 DIAGNOSIS — J302 Other seasonal allergic rhinitis: Secondary | ICD-10-CM

## 2019-12-09 MED ORDER — CETIRIZINE HCL 10 MG PO TABS: 10.0000 mg | ORAL_TABLET | Freq: Every day | ORAL | 3 refills | Status: AC

## 2019-12-09 MED ORDER — OMEPRAZOLE 40 MG PO CPDR: 40.0000 mg | DELAYED_RELEASE_CAPSULE | Freq: Every day | ORAL | 0 refills | Status: DC

## 2019-12-09 MED ORDER — FLUTICASONE PROPIONATE 50 MCG/ACT NA SUSP: 2.0000 | Freq: Every day | NASAL | 5 refills | Status: AC

## 2019-12-09 NOTE — Progress Notes (Signed)
Subjective:  Patient ID: Megan Elliott, female    DOB: March 18, 1995  Age: 24 y.o. MRN: [id number]  CC: Acute Visit (Pt states her voice comes and goes x2 weeks. Pt states if she talks to much she loses her voice, Monday she was experiencing difficulty swallowing. )  Sore Throat  This is a new problem. The current episode started 1 to 4 weeks ago. The problem has been waxing and waning. There has been no fever. Associated symptoms include a hoarse voice. Pertinent negatives include no congestion, coughing, drooling, ear pain, headaches, shortness of breath, stridor, swollen glands, trouble swallowing or vomiting. She has had no exposure to strep or mono. Treatments tried: antihistamine. The treatment provided mild relief.     Reviewed past Medical, Social and Family history today.  Outpatient Medications Prior to Visit  Medication Sig Dispense Refill  . albuterol (PROVENTIL HFA;VENTOLIN HFA) 108 (90 Base) MCG/ACT inhaler INL 2 PFS PO Q 4 TO 6 H PRF CHEST TIGHTNESS OR WHZ    . levonorgestrel (MIRENA, 52 MG,) 20 MCG/24HR IUD 1 each by Intrauterine route once. Inserted 12/04/2017 by [facility] 1 each 0  . venlafaxine XR (EFFEXOR-XR) 75 MG 24 hr capsule TAKE 1 CAPSULE(75 MG) BY MOUTH DAILY WITH BREAKFAST 90 capsule 3  . cetirizine (ZYRTEC) 10 MG tablet Take 1 tablet (10 mg total) by mouth daily. 30 tablet 0  . fluticasone (FLONASE) 50 MCG/ACT nasal spray Place into both nostrils daily.    . busPIRone (BUSPAR) 7.5 MG tablet TAKE 1 TABLET(7.5 MG) BY MOUTH THREE TIMES DAILY AS NEEDED (Patient not taking: Reported on 12/09/2019) 90 tablet 5  . lactulose (CHRONULAC) 10 GM/15ML solution Take 30 mLs (20 g total) by mouth daily as needed for moderate constipation or severe constipation. (Patient not taking: Reported on 12/09/2019) 236 mL 1  . Magnesium 250 MG TABS Take 1 tablet (250 mg total) by mouth daily. (Patient not taking: Reported on 12/09/2019)  0   No facility-administered medications  prior to visit.    ROS See HPI  Objective:  BP 122/80 (BP Location: Left Arm, Patient Position: Sitting, Cuff Size: Normal)   Pulse 82   Temp (!) 97.3 F (36.3 C) (Temporal)   Ht 5\' 9"  (1.753 m)   Wt 175 lb (79.4 kg)   BMI 25.84 kg/m   Physical Exam Vitals reviewed.  HENT:     Right Ear: Tympanic membrane, ear canal and external ear normal.     Left Ear: Tympanic membrane, ear canal and external ear normal.     Mouth/Throat:     Mouth: Mucous membranes are moist.     Pharynx: Uvula midline. No pharyngeal swelling or posterior oropharyngeal erythema.     Tonsils: No tonsillar exudate.  Neck:     Thyroid: No thyroid mass, thyromegaly or thyroid tenderness.  Cardiovascular:     Rate and Rhythm: Normal rate.     Pulses: Normal pulses.  Pulmonary:     Effort: Pulmonary effort is normal.  Musculoskeletal:     Cervical back: Normal range of motion and neck supple.  Lymphadenopathy:     Cervical: No cervical adenopathy.  Skin:    Findings: No erythema or rash.  Neurological:     Mental Status: She is alert and oriented to person, place, and time.    Assessment & Plan:  This visit occurred during the SARS-CoV-2 public health emergency.  Safety protocols were in place, including screening questions prior to the visit, additional usage of staff PPE,  and extensive cleaning of exam room while observing appropriate contact time as indicated for disinfecting solutions.   Shakiera was seen today for acute visit.  Diagnoses and all orders for this visit:  Laryngitis -     omeprazole (PRILOSEC) 40 MG capsule; Take 1 capsule (40 mg total) by mouth daily. -     Ambulatory referral to ENT  Seasonal allergic rhinitis, unspecified trigger -     cetirizine (ZYRTEC) 10 MG tablet; Take 1 tablet (10 mg total) by mouth daily. -     fluticasone (FLONASE) 50 MCG/ACT nasal spray; Place 2 sprays into both nostrils daily.    Problem List Items Addressed This Visit    None    Visit  Diagnoses    Laryngitis    -  Primary   Relevant Medications   omeprazole (PRILOSEC) 40 MG capsule   Other Relevant Orders   Ambulatory referral to ENT   Seasonal allergic rhinitis, unspecified trigger       Relevant Medications   cetirizine (ZYRTEC) 10 MG tablet   fluticasone (FLONASE) 50 MCG/ACT nasal spray      Follow-up: No follow-ups on file.  [name], NP

## 2019-12-09 NOTE — Patient Instructions (Addendum)
Schedule appt with ENT if no improvement.   Laryngitis  Laryngitis is inflammation of the vocal cords that causes symptoms such as hoarseness or loss of voice. The vocal cords are two bands of muscles in your throat. When you speak, these cords come together and vibrate. The vibrations come out through your mouth as sound. When your vocal cords are inflamed, your voice sounds different. Laryngitis can be temporary (acute) or long-term (chronic). Most cases of acute laryngitis improve with time. Chronic laryngitis is laryngitis that lasts for more than 3 weeks. What are the causes? Acute laryngitis may be caused by:  A viral infection.  Lots of talking, yelling, or singing. This is also called vocal strain.  A bacterial infection. Chronic laryngitis may be caused by:  Vocal strain.  Injury to your vocal cords.  Acid reflux (gastroesophageal reflux disease, or GERD).  Allergies.  A sinus infection.  Smoking.  Alcohol abuse.  Breathing in chemicals or dust.  Growths on the vocal cords. What increases the risk? The following factors may make you more likely to develop this condition:  Smoking.  Alcohol abuse.  Having allergies.  Chronic irritants in the workplace, such as toxic fumes. What are the signs or symptoms? Symptoms of this condition may include:  Low, hoarse voice.  Loss of voice.  Dry cough.  Sore or dry throat.  Stuffy nose. How is this diagnosed? This condition may be diagnosed based on:  Your symptoms and a physical exam.  Throat culture.  Blood test.  A procedure in which your health care provider looks at your vocal cords with a mirror or viewing tube (laryngoscopy). How is this treated? Treatment for laryngitis depends on what is causing it.  Usually, treatment involves resting your voice and using medicines to soothe your throat.  If your laryngitis is caused by a bacterial infection, you may need to take antibiotic medicine.  If  your laryngitis is caused by a growth, you may need to have a procedure to remove it. Follow these instructions at home: Medicines  Take over-the-counter and prescription medicines only as told by your health care provider.  If you were prescribed an antibiotic medicine, take it as told by your health care provider. Do not stop taking the antibiotic even if you start to feel better. General instructions  Talk as little as possible. Also avoid whispering, which can cause vocal strain.  Write instead of talking. Do this until your voice is back to normal.  Drink enough fluid to keep your urine pale yellow.  Breathe in moist air. Use a humidifier if you live in a dry climate.  Do not use any products that contain nicotine or tobacco, such as cigarettes and e-cigarettes. If you need help quitting, ask your health care provider. Contact a health care provider if:  You have a fever.  You have increasing pain.  Your symptoms do not get better in 2 weeks. Get help right away if:  You cough up blood.  You have difficulty swallowing.  You have trouble breathing. Summary  Laryngitis is inflammation of the vocal cords that causes symptoms such as hoarseness or loss of voice.  Laryngitis can be temporary (acute) or long-term (chronic).  Treatment for laryngitis depends on the cause. It often involves resting your voice and using medicine to soothe your throat. This information is not intended to replace advice given to you by your health care provider. Make sure you discuss any questions you have with your health care provider.  Document Revised: 01/01/2017 Document Reviewed: 01/01/2017 Elsevier Patient Education  2020 [employer].

## 2020-04-18 ENCOUNTER — Encounter: Payer: Self-pay | Admitting: Nurse Practitioner

## 2020-04-18 ENCOUNTER — Ambulatory Visit (INDEPENDENT_AMBULATORY_CARE_PROVIDER_SITE_OTHER): Payer: Managed Care, Other (non HMO) | Admitting: Nurse Practitioner

## 2020-04-18 ENCOUNTER — Other Ambulatory Visit: Payer: Self-pay

## 2020-04-18 VITALS — BP 124/64 | HR 87 | Temp 97.5°F | Ht 70.0 in | Wt 183.8 lb

## 2020-04-18 DIAGNOSIS — F331 Major depressive disorder, recurrent, moderate: Secondary | ICD-10-CM | POA: Diagnosis not present

## 2020-04-18 MED ORDER — VENLAFAXINE HCL ER 75 MG PO CP24: ORAL_CAPSULE | ORAL | 3 refills | Status: DC

## 2020-04-18 NOTE — Patient Instructions (Signed)
Use website: [email or web address]  Or [email or web address] to find psychologist within your network. Continue current medication.

## 2020-04-18 NOTE — Progress Notes (Signed)
Subjective:  Patient ID: Megan Elliott, female    DOB: 07-30-1995  Age: 25 y.o. MRN: [id number]  CC: Follow-up (6 month f/u on anxiety and depression. Pt states she has been dealing with a lot of new stressors since her last visit and thinks this could be why medication is not working as well. )  HPI  Depression Stable mood but increase stress due to working 2jobs and going to school. Has 2jobs to pay rent. She does not qualify for financial aid. She is optimistic that this situation will improve in next 44month. Was unable to schedule appt with [facility] because she is not a  resident. Advised to use websites: [email or web address]  Or [email or web address] to find a therapist in her network. She agreed continue current dose at Effexor at this time F/up in 11months or sooner if needed  Depression screen [facility] 2/9 04/18/2020 10/19/2019 09/18/2018  Decreased Interest 2 1 0  Down, Depressed, Hopeless 1 2 0  PHQ - 2 Score 3 3 0  Altered sleeping 3 3 1   Tired, decreased energy 3 1 1   Change in appetite 1 1 1   Feeling bad or failure about yourself  2 0 0  Trouble concentrating 3 0 0  Moving slowly or fidgety/restless 2 0 0  Suicidal thoughts 0 0 0  PHQ-9 Score 17 8 3   Difficult doing work/chores Somewhat difficult Somewhat difficult -   GAD 7 : Generalized Anxiety Score 04/18/2020 10/19/2019 09/18/2018 03/25/2018  Nervous, Anxious, on Edge 2 2 1 1   Control/stop worrying 1 2 1 2   Worry too much - different things 1 3 0 1  Trouble relaxing 3 2 0 1  Restless 1 0 0 1  Easily annoyed or irritable 1 1 0 1  Afraid - awful might happen 0 0 0 0  Total GAD 7 Score 9 10 2 7   Anxiety Difficulty Somewhat difficult Very difficult - -   Reviewed past Medical, Social and Family history today.  Outpatient Medications Prior to Visit  Medication Sig Dispense Refill  . albuterol (PROVENTIL HFA;VENTOLIN HFA) 108 (90 Base) MCG/ACT inhaler INL 2 PFS PO Q 4 TO 6 H PRF CHEST TIGHTNESS OR WHZ    .  cetirizine (ZYRTEC) 10 MG tablet Take 1 tablet (10 mg total) by mouth daily. 90 tablet 3  . fluticasone (FLONASE) 50 MCG/ACT nasal spray Place 2 sprays into both nostrils daily. 16 g 5  . levonorgestrel (MIRENA) 20 MCG/24HR IUD 1 each by Intrauterine route once. Inserted 12/04/2017 by [employer] 1 each 0  . venlafaxine XR (EFFEXOR-XR) 75 MG 24 hr capsule TAKE 1 CAPSULE(75 MG) BY MOUTH DAILY WITH BREAKFAST 90 capsule 3  . omeprazole (PRILOSEC) 40 MG capsule Take 1 capsule (40 mg total) by mouth daily. (Patient not taking: Reported on 04/18/2020) 14 capsule 0   No facility-administered medications prior to visit.   ROS See HPI  Objective:  BP 124/64 (BP Location: Left Arm, Patient Position: Sitting, Cuff Size: Normal)   Pulse 87   Temp (!) 97.5 F (36.4 C) (Temporal)   Ht 5\' 10"  (1.778 m)   Wt 183 lb 12.8 oz (83.4 kg)   BMI 26.37 kg/m   Physical Exam Neurological:     Mental Status: She is alert.  Psychiatric:        Mood and Affect: Mood normal.        Behavior: Behavior normal.        Thought Content: Thought content normal.  Cognition and Memory: Cognition normal.        Judgment: Judgment normal.    Assessment & Plan:  This visit occurred during the SARS-CoV-2 public health emergency.  Safety protocols were in place, including screening questions prior to the visit, additional usage of staff PPE, and extensive cleaning of exam room while observing appropriate contact time as indicated for disinfecting solutions.   Fotini was seen today for follow-up.  Diagnoses and all orders for this visit:  Moderate episode of recurrent major depressive disorder (HCC) -     venlafaxine XR (EFFEXOR-XR) 75 MG 24 hr capsule; TAKE 1 CAPSULE(75 MG) BY MOUTH DAILY WITH BREAKFAST   Problem List Items Addressed This Visit      Other   Depression - Primary    Stable mood but increase stress due to working 2jobs and going to school. Has 2jobs to pay rent. She does not qualify  for financial aid. She is optimistic that this situation will improve in next 45month. Was unable to schedule appt with [facility] because she is not a [place] resident. Advised to use websites: [email or web address]  Or psychologytoday.com to find a therapist in her network. She agreed continue current dose at Effexor at this time F/up in 13months or sooner if needed      Relevant Medications   venlafaxine XR (EFFEXOR-XR) 75 MG 24 hr capsule      Follow-up: Return in about 6 months (around 10/19/2020) for CPE (fasting).  [name], NP

## 2020-04-18 NOTE — Assessment & Plan Note (Addendum)
Stable mood but increase stress due to working 2jobs and going to school. Has 2jobs to pay rent. She does not qualify for financial aid. She is optimistic that this situation will improve in next 105month. Was unable to schedule appt with [facility] because she is not a [place] resident. Advised to use websites: [email or web address]  Or [email or web address] to find a therapist in her network. She agreed continue current dose at Effexor at this time F/up in 50months or sooner if needed

## 2020-06-27 ENCOUNTER — Encounter: Payer: Self-pay | Admitting: Nurse Practitioner

## 2020-06-27 ENCOUNTER — Ambulatory Visit (INDEPENDENT_AMBULATORY_CARE_PROVIDER_SITE_OTHER): Payer: Managed Care, Other (non HMO) | Admitting: Nurse Practitioner

## 2020-06-27 ENCOUNTER — Other Ambulatory Visit: Payer: Self-pay

## 2020-06-27 VITALS — BP 124/86 | Temp 98.0°F | Ht 70.0 in | Wt 181.4 lb

## 2020-06-27 DIAGNOSIS — M79672 Pain in left foot: Secondary | ICD-10-CM | POA: Diagnosis not present

## 2020-06-27 DIAGNOSIS — R1013 Epigastric pain: Secondary | ICD-10-CM

## 2020-06-27 MED ORDER — OMEPRAZOLE 20 MG PO CPDR: 20.0000 mg | DELAYED_RELEASE_CAPSULE | Freq: Every day | ORAL | 0 refills | Status: DC

## 2020-06-27 NOTE — Patient Instructions (Signed)
You will be contacted to schedule an appt with podiatry Continue to use shoe insert for additional support  Go to lab for H. Pylori test. Start omeprazole Call office if no improvement in 2weeks  Gastroesophageal Reflux Disease, Adult  Gastroesophageal reflux (GER) happens when acid from the stomach flows up into the tube that connects the mouth and the stomach (esophagus). Normally, food travels down the esophagus and stays in the stomach to be digested. With GER, food and stomach acid sometimes move back up into the esophagus. You may have a disease called gastroesophageal reflux disease (GERD) if the reflux:  Happens often.  Causes frequent or very bad symptoms.  Causes problems such as damage to the esophagus. When this happens, the esophagus becomes sore and swollen. Over time, GERD can make small holes (ulcers) in the lining of the esophagus. What are the causes? This condition is caused by a problem with the muscle between the esophagus and the stomach. When this muscle is weak or not normal, it does not close properly to keep food and acid from coming back up from the stomach. The muscle can be weak because of:  Tobacco use.  Pregnancy.  Having a certain type of hernia (hiatal hernia).  Alcohol use.  Certain foods and drinks, such as coffee, chocolate, onions, and peppermint. What increases the risk?  Being overweight.  Having a disease that affects your connective tissue.  Taking NSAIDs, such a ibuprofen. What are the signs or symptoms?  Heartburn.  Difficult or painful swallowing.  The feeling of having a lump in the throat.  A bitter taste in the mouth.  Bad breath.  Having a lot of saliva.  Having an upset or bloated stomach.  Burping.  Chest pain. Different conditions can cause chest pain. Make sure you see your doctor if you have chest pain.  Shortness of breath or wheezing.  A long-term cough or a cough at night.  Wearing away of the  surface of teeth (tooth enamel).  Weight loss. How is this treated?  Making changes to your diet.  Taking medicine.  Having surgery. Treatment will depend on how bad your symptoms are. Follow these instructions at home: Eating and drinking  Follow a diet as told by your doctor. You may need to avoid foods and drinks such as: ? Coffee and tea, with or without caffeine. ? Drinks that contain alcohol. ? Energy drinks and sports drinks. ? Bubbly (carbonated) drinks or sodas. ? Chocolate and cocoa. ? Peppermint and mint flavorings. ? Garlic and onions. ? Horseradish. ? Spicy and acidic foods. These include peppers, chili powder, curry powder, vinegar, hot sauces, and BBQ sauce. ? Citrus fruit juices and citrus fruits, such as oranges, lemons, and limes. ? Tomato-based foods. These include red sauce, chili, salsa, and pizza with red sauce. ? Fried and fatty foods. These include donuts, french fries, potato chips, and high-fat dressings. ? High-fat meats. These include hot dogs, rib eye steak, sausage, ham, and bacon. ? High-fat dairy items, such as whole milk, butter, and cream cheese.  Eat small meals often. Avoid eating large meals.  Avoid drinking large amounts of liquid with your meals.  Avoid eating meals during the 2-3 hours before bedtime.  Avoid lying down right after you eat.  Do not exercise right after you eat.   Lifestyle  Do not smoke or use any products that contain nicotine or tobacco. If you need help quitting, ask your doctor.  Try to lower your stress. If you  need help doing this, ask your doctor.  If you are overweight, lose an amount of weight that is healthy for you. Ask your doctor about a safe weight loss goal.   General instructions  Pay attention to any changes in your symptoms.  Take over-the-counter and prescription medicines only as told by your doctor.  Do not take aspirin, ibuprofen, or other NSAIDs unless your doctor says it is  okay.  Wear loose clothes. Do not wear anything tight around your waist.  Raise (elevate) the head of your bed about 6 inches (15 cm). You may need to use a wedge to do this.  Avoid bending over if this makes your symptoms worse.  Keep all follow-up visits. Contact a doctor if:  You have new symptoms.  You lose weight and you do not know why.  You have trouble swallowing or it hurts to swallow.  You have wheezing or a cough that keeps happening.  You have a hoarse voice.  Your symptoms do not get better with treatment. Get help right away if:  You have sudden pain in your arms, neck, jaw, teeth, or back.  You suddenly feel sweaty, dizzy, or light-headed.  You have chest pain or shortness of breath.  You vomit and the vomit is green, yellow, or black, or it looks like blood or coffee grounds.  You faint.  Your poop (stool) is red, bloody, or black.  You cannot swallow, drink, or eat. These symptoms may represent a serious problem that is an emergency. Do not wait to see if the symptoms will go away. Get medical help right away. Call your local emergency services (911 in the [place] Do not drive yourself to the hospital. Summary  If a person has gastroesophageal reflux disease (GERD), food and stomach acid move back up into the esophagus and cause symptoms or problems such as damage to the esophagus.  Treatment will depend on how bad your symptoms are.  Follow a diet as told by your doctor.  Take all medicines only as told by your doctor. This information is not intended to replace advice given to you by your health care provider. Make sure you discuss any questions you have with your health care provider. Document Revised: 07/26/2019 Document Reviewed: 07/26/2019 Elsevier Patient Education  2021 [employer].

## 2020-06-27 NOTE — Progress Notes (Signed)
Subjective:  Patient ID: Megan Elliott, female    DOB: 10-31-1995  Age: 25 y.o. MRN: [id number]  CC: Acute Visit (Pt c/o left foot pain when walking and standing x 3-4 months. 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 Pt states at times she would get a burning sensation in her chest and when taking antiacid medication it would relieve the pain some. )  Gastroesophageal Reflux She complains of abdominal pain, belching, heartburn and nausea. She reports no choking, no coughing, no dysphagia, no early satiety, no globus sensation, no hoarse voice, no sore throat, no stridor, no tooth decay, no water brash or no wheezing. This is a new problem. The current episode started more than 1 month ago. The problem occurs frequently. The problem has been unchanged. The heartburn is located in the substernum. The heartburn is of mild intensity. The heartburn does not wake her from sleep. The heartburn does not limit her activity. The heartburn doesn't change with position. The symptoms are aggravated by ETOH, certain foods and lying down. Pertinent negatives include no anemia, fatigue, melena, muscle weakness, orthopnea or weight loss. Risk factors include ETOH use and caffeine use. She has tried an antacid for the symptoms. The treatment provided moderate relief. Past procedures do not include an abdominal ultrasound, an EGD, esophageal manometry, esophageal pH monitoring, H. pylori antibody titer or a UGI. Past invasive treatments do not include gastroplasty, gastroplication or reflux surgery.   She also reports left foot pain x 3-26months. 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 Pain is intermittent but worse with standing and walking. Denies any foot injury, no numbness or tingling, no swelling. No improvement with use of inserts.  Reviewed past Medical, Social and Family history today.  Outpatient Medications Prior to Visit  Medication Sig  Dispense Refill  . albuterol (PROVENTIL HFA;VENTOLIN HFA) 108 (90 Base) MCG/ACT inhaler INL 2 PFS PO Q 4 TO 6 H PRF CHEST TIGHTNESS OR WHZ    . cetirizine (ZYRTEC) 10 MG tablet Take 1 tablet (10 mg total) by mouth daily. 90 tablet 3  . fluticasone (FLONASE) 50 MCG/ACT nasal spray Place 2 sprays into both nostrils daily. 16 g 5  . levonorgestrel (MIRENA) 20 MCG/24HR IUD 1 each by Intrauterine route once. Inserted 12/04/2017 by [employer] 1 each 0  . venlafaxine XR (EFFEXOR-XR) 75 MG 24 hr capsule TAKE 1 CAPSULE(75 MG) BY MOUTH DAILY WITH BREAKFAST 90 capsule 3   No facility-administered medications prior to visit.    ROS See HPI  Objective:  BP 124/86 (BP Location: Left Arm, Patient Position: Sitting, Cuff Size: Normal)   Temp 98 F (36.7 C) (Temporal)   Ht 5\' 10"  (1.778 m)   Wt 181 lb 6.4 oz (82.3 kg)   BMI 26.03 kg/m   Physical Exam Vitals reviewed.  Cardiovascular:     Pulses:          Dorsalis pedis pulses are 2+ on the right side and 2+ on the left side.       Posterior tibial pulses are 2+ on the right side and 2+ on the left side.  Abdominal:     General: There is no distension.     Palpations: Abdomen is soft. There is no mass.     Tenderness: There is no abdominal tenderness. There is no right CVA tenderness, left CVA tenderness or guarding.     Hernia: No hernia is present.  Musculoskeletal:        General: No swelling, tenderness, deformity or signs of injury.     Right lower leg: No edema.     Left lower leg: No edema.     Right foot: Normal range of motion. Bunion present. No deformity or prominent metatarsal heads.     Left foot: Normal range of motion. No deformity, bunion, foot drop or prominent metatarsal heads.       Feet:  Feet:     Right foot:     Skin integrity: Skin integrity normal.     Left foot:     Skin integrity: Skin integrity normal.  Skin:    General: Skin is warm and dry.  Neurological:     Mental Status: She is alert and  oriented to person, place, and time.    Assessment & Plan:  This visit occurred during the SARS-CoV-2 public health emergency.  Safety protocols were in place, including screening questions prior to the visit, additional usage of staff PPE, and extensive cleaning of exam room while observing appropriate contact time as indicated for disinfecting solutions.   Megan Elliott was seen today for acute visit.  Diagnoses and all orders for this visit:  Dyspepsia -     H. pylori breath test -     omeprazole (PRILOSEC) 20 MG capsule; Take 1 capsule (20 mg total) by mouth daily.  Pain of left midfoot -     Ambulatory referral to Podiatry   Problem List Items Addressed This Visit   None   Visit Diagnoses    Dyspepsia    -  Primary   Relevant Medications   omeprazole (PRILOSEC) 20 MG capsule   Other Relevant Orders   H. pylori breath test   Pain of left midfoot       Relevant Orders   Ambulatory referral to Podiatry      Follow-up: Return in about 3 months (around 09/27/2020) for CPE (fasting).  [name], NP

## 2020-07-06 ENCOUNTER — Ambulatory Visit (INDEPENDENT_AMBULATORY_CARE_PROVIDER_SITE_OTHER): Payer: Managed Care, Other (non HMO)

## 2020-07-06 ENCOUNTER — Other Ambulatory Visit: Payer: Self-pay

## 2020-07-06 ENCOUNTER — Ambulatory Visit (INDEPENDENT_AMBULATORY_CARE_PROVIDER_SITE_OTHER): Payer: Managed Care, Other (non HMO) | Admitting: Podiatry

## 2020-07-06 ENCOUNTER — Encounter: Payer: Self-pay | Admitting: Podiatry

## 2020-07-06 DIAGNOSIS — M778 Other enthesopathies, not elsewhere classified: Secondary | ICD-10-CM

## 2020-07-06 DIAGNOSIS — M21619 Bunion of unspecified foot: Secondary | ICD-10-CM

## 2020-07-06 MED ORDER — DICLOFENAC SODIUM 75 MG PO TBEC: 75.0000 mg | DELAYED_RELEASE_TABLET | Freq: Two times a day (BID) | ORAL | 2 refills | Status: DC

## 2020-07-10 ENCOUNTER — Telehealth: Payer: Self-pay

## 2020-07-10 NOTE — Telephone Encounter (Signed)
Called patient and LVM informing her that h.pylori test were in and to schedule a nurse visit for testing to be done.

## 2020-07-11 ENCOUNTER — Ambulatory Visit: Payer: Managed Care, Other (non HMO)

## 2020-07-11 ENCOUNTER — Other Ambulatory Visit (INDEPENDENT_AMBULATORY_CARE_PROVIDER_SITE_OTHER): Payer: Managed Care, Other (non HMO)

## 2020-07-11 ENCOUNTER — Other Ambulatory Visit: Payer: Self-pay

## 2020-07-11 DIAGNOSIS — M778 Other enthesopathies, not elsewhere classified: Secondary | ICD-10-CM | POA: Diagnosis not present

## 2020-07-11 DIAGNOSIS — R1013 Epigastric pain: Secondary | ICD-10-CM | POA: Diagnosis not present

## 2020-07-11 MED ORDER — TRIAMCINOLONE ACETONIDE 10 MG/ML IJ SUSP
10.0000 mg | Freq: Once | INTRAMUSCULAR | Status: AC
  Administered 2020-07-11: 10 mg

## 2020-07-11 NOTE — Progress Notes (Addendum)
Per orders of NP [name] pt is here for H. Pylori test via breath, pt was able to tolerate test without any complications. Test was 15 mins starting at 2:50, ending at 3:05

## 2020-07-11 NOTE — Progress Notes (Signed)
Subjective:   Patient ID: Megan Elliott, female   DOB: 25 y.o.   MRN: [id number]   HPI Patient presents stating she has a lot of pain on top of her left foot does not remember specific injury and states she may have a moderate bunion deformity.  States its been hurting her for a couple months and does not remember injury and does not smoke likes to be active   Review of Systems  All other systems reviewed and are negative.      Objective:  Physical Exam Vitals and nursing note reviewed.  Constitutional:      Appearance: She is well-developed.  Pulmonary:     Effort: Pulmonary effort is normal.  Musculoskeletal:        General: Normal range of motion.  Skin:    General: Skin is warm.  Neurological:     Mental Status: She is alert.    Neurovascular status found to be intact muscle strength adequate range of motion adequate with pain that extends into the first MPJ left and also into the extensor tendon complex proximal to this point.  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  Reappoint to recheck  X-rays indicate there is mild bunion deformity no indications of arthritis or stress fracture     Patient presents stating

## 2020-07-12 LAB — H. PYLORI BREATH TEST

## 2020-07-14 NOTE — Addendum Note (Signed)
Addended by: Renaldo Reel S on: 07/14/2020 03:00 PM   Modules accepted: Orders

## 2020-07-15 LAB — H. PYLORI BREATH TEST: H pylori Breath Test: NEGATIVE

## 2020-09-29 ENCOUNTER — Other Ambulatory Visit (INDEPENDENT_AMBULATORY_CARE_PROVIDER_SITE_OTHER): Payer: Self-pay | Admitting: Family

## 2020-10-20 ENCOUNTER — Encounter: Payer: Managed Care, Other (non HMO) | Admitting: Nurse Practitioner

## 2020-11-12 ENCOUNTER — Encounter (HOSPITAL_COMMUNITY): Payer: Self-pay

## 2020-11-12 ENCOUNTER — Emergency Department (HOSPITAL_COMMUNITY): Payer: Managed Care, Other (non HMO)

## 2020-11-12 ENCOUNTER — Other Ambulatory Visit: Payer: Self-pay

## 2020-11-12 ENCOUNTER — Emergency Department (HOSPITAL_COMMUNITY)
Admission: EM | Admit: 2020-11-12 | Discharge: 2020-11-13 | Disposition: A | Discharge status: Home / Self Care | Payer: Managed Care, Other (non HMO) | Attending: Emergency Medicine | Admitting: Emergency Medicine

## 2020-11-12 DIAGNOSIS — R202 Paresthesia of skin: Secondary | ICD-10-CM | POA: Diagnosis not present

## 2020-11-12 DIAGNOSIS — M7541 Impingement syndrome of right shoulder: Secondary | ICD-10-CM

## 2020-11-12 DIAGNOSIS — Z91010 Allergy to peanuts: Secondary | ICD-10-CM | POA: Diagnosis not present

## 2020-11-12 DIAGNOSIS — Z7951 Long term (current) use of inhaled steroids: Secondary | ICD-10-CM | POA: Insufficient documentation

## 2020-11-12 DIAGNOSIS — J45909 Unspecified asthma, uncomplicated: Secondary | ICD-10-CM | POA: Diagnosis not present

## 2020-11-12 DIAGNOSIS — M25511 Pain in right shoulder: Secondary | ICD-10-CM | POA: Diagnosis present

## 2020-11-12 HISTORY — DX: Anxiety disorder, unspecified: F41.9

## 2020-11-12 HISTORY — DX: Depression, unspecified: F32.A

## 2020-11-12 NOTE — ED Provider Notes (Signed)
Emergency Medicine Provider Triage Evaluation Note  Megan Elliott , a 25 y.o. female  was evaluated in triage.  Pt complains of right shoulder pain with movement.  No injury, just began hurting  Review of Systems  Positive: Shoulder pain, on and off tingling Negative: Injury or falls  Physical Exam  BP (!) 146/85 (BP Location: Left Arm)   Pulse 86   Temp 97.9 F (36.6 C) (Oral)   Resp 19   Ht 5\' 10"  (1.778 m)   Wt 81.6 kg   SpO2 96%   BMI 25.83 kg/m  Gen:   Awake, no distress   Resp:  Normal effort  MSK:   Pain with shoulder flexion that limits range of motion past 60 degrees Other:    Medical Decision Making  Medically screening exam initiated at 9:37 PM.  Appropriate orders placed.  Megan Elliott was informed that the remainder of the evaluation will be completed by another provider, this initial triage assessment does not replace that evaluation, and the importance of remaining in the ED until their evaluation is complete.     Megan Elliott 11/12/20 2138    2139, MD 11/12/20 2234

## 2020-11-12 NOTE — ED Triage Notes (Signed)
Starting Friday, patients right shoulder was hurting. Yesterday it still was hurting, she went to work. But today, it was "killing her" and she cannot focus anymore with her school work. She iced it. Now pain is starting to go down to her elbow. Her fingers feel numb on her right side. The pain came out of nowhere, she does not recall falling.

## 2020-11-13 MED ORDER — IBUPROFEN 600 MG PO TABS: 600.0000 mg | ORAL_TABLET | Freq: Four times a day (QID) | ORAL | 0 refills | Status: AC | PRN

## 2020-11-13 MED ORDER — CYCLOBENZAPRINE HCL 10 MG PO TABS: 10.0000 mg | ORAL_TABLET | Freq: Two times a day (BID) | ORAL | 0 refills | Status: DC | PRN

## 2020-11-13 MED ORDER — IBUPROFEN 800 MG PO TABS
800.0000 mg | ORAL_TABLET | Freq: Once | ORAL | Status: AC
  Administered 2020-11-13: 800 mg via ORAL
  Filled 2020-11-13: qty 1

## 2020-11-13 NOTE — ED Provider Notes (Signed)
[facility]-EMERGENCY DEPT Provider Note   CSN: [id number] Arrival date & time: 11/12/20  2053     History Chief Complaint  Patient presents with   Shoulder Pain    Megan Elliott is a 25 y.o. female.  Patient presents to the emergency department with a chief complaint of right shoulder pain.  She reports onset was 2 to 3 days ago.  She denies any specific injury.  She states that she may have slept on it wrong.  She sleeps with her arm up.  She reports that she did have some tingling sensation down into her thumb, index, and middle fingers.  She denies weakness, but does report pain with movement.  She states that is particularly bad with overhead lifting.  She denies any successful treatments prior to arrival.  Denies any fever or chills.  The history is provided by the patient. No language interpreter was used.      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     Family History  Problem Relation Age of Onset   Hypertension Father    Diabetes Maternal Grandmother    Epilepsy Paternal Grandmother     Social History   Tobacco Use   Smoking status: Never   Smokeless tobacco: Never  Vaping Use   Vaping Use: Never used  Substance Use Topics   Alcohol use: Yes   Drug use: Never     Home Medications Prior to Admission medications   Medication Sig Start Date End Date Taking? Authorizing Provider  albuterol (PROVENTIL HFA;VENTOLIN HFA) 108 (90 Base) MCG/ACT inhaler INL 2 PFS PO Q 4 TO 6 H PRF CHEST TIGHTNESS OR WHZ 03/17/18   [provider]  cetirizine (ZYRTEC) 10 MG tablet Take 1 tablet (10 mg total) by mouth daily. 12/09/19   Nche, Bonna Gains, NP  diclofenac (VOLTAREN) 75 MG EC tablet Take 1 tablet (75 mg total) by mouth 2 (two) times daily. 07/06/20   Lenn Sink, DPM  fluticasone (FLONASE) 50 MCG/ACT nasal spray Place 2 sprays into both nostrils daily. 12/09/19   Nche, Bonna Gains, NP  levonorgestrel (MIRENA) 20 MCG/24HR IUD 1 each by Intrauterine route once. Inserted 12/04/2017 by [facility] 12/16/17   Nche, Bonna Gains, NP  omeprazole (PRILOSEC) 20 MG capsule Take 1 capsule (20 mg total) by mouth daily. 06/27/20   Nche, Bonna Gains, NP  venlafaxine XR (EFFEXOR-XR) 75 MG 24 hr capsule TAKE 1 CAPSULE(75 MG) BY MOUTH DAILY WITH BREAKFAST 04/18/20   Nche, Bonna Gains, NP    Allergies    Cat hair extract, Other, and Peanut-containing drug products  Review of Systems   Review of Systems  All other systems reviewed and are negative.  Physical Exam Updated Vital Signs BP Marland Kitchen)  148/100 (BP Location: Left Arm)   Pulse 69   Temp 97.9 F (36.6 C) (Oral)   Resp 18   Ht 5\' 10"  (1.778 m)   Wt 81.6 kg   SpO2 100%   BMI 25.83 kg/m   Physical Exam Nursing note and vitals reviewed.  Constitutional: Pt appears well-developed and well-nourished. No distress.  HENT:  Head: Normocephalic and atraumatic.  Eyes: Conjunctivae are normal.  Neck: Normal range of motion.  Cardiovascular: Normal rate, regular rhythm. Intact distal pulses.   Capillary refill < 3 sec.  Pulmonary/Chest: Effort normal and breath sounds normal.  Musculoskeletal:  Right shoulder Pt exhibits no bony deformity or abnormality.  Positive Hawkins-Kennedy impingement  test ROM: limited by pain, specifically painful with attempted overhead movements  Strength: empty can and external rotation strength 5/5  Neurological: Pt  is alert. Coordination normal.  Sensation: 5/5 Skin: Skin is warm and dry. Pt is not diaphoretic.  No evidence of open wound or skin tenting Psychiatric: Pt has a normal mood and affect.   ED Results / Procedures / Treatments   Labs (all labs ordered are listed, but only abnormal results are displayed) Labs Reviewed - No data to display  EKG None  Radiology DG Shoulder Right  Result Date: 11/12/2020 CLINICAL DATA:  Right-sided shoulder pain, no known injury, initial encounter EXAM: RIGHT SHOULDER - 2+ VIEW COMPARISON:  None. FINDINGS: There is no evidence of fracture or dislocation. There is no evidence of arthropathy or other focal bone abnormality. Soft tissues are unremarkable. IMPRESSION: No acute abnormality noted. Electronically Signed   By: 11/14/2020 M.D.   On: 11/12/2020 22:02    Procedures Procedures   Medications Ordered in ED Medications  ibuprofen (ADVIL) tablet 800 mg (has no administration in time range)    ED Course  I have reviewed the triage vital signs and the nursing notes.  Pertinent labs & imaging results that were available during my care of the patient were reviewed by me and considered in my medical decision making (see chart for details).    MDM Rules/Calculators/A&P                           Patient presents with injury to right shoulder.  DDx includes, fracture, strain, or sprain.  Consultants: none  Plain films reveal negative.  Pt advised to follow up with PCP and/or orthopedics. Patient given NSAIDs while in ED, conservative therapy such as RICE recommended and discussed.  Symptoms seem most consistent with shoulder impingement syndrome.  Patient will be discharged home & is agreeable with above plan. Returns precautions discussed. Pt appears safe for discharge.  Final Clinical  Impression(s) / ED Diagnoses Final diagnoses:  Impingement syndrome of right shoulder    Rx / DC Orders ED Discharge Orders          Ordered    ibuprofen (ADVIL) 600 MG tablet  Every 6 hours PRN        11/13/20 0202    cyclobenzaprine (FLEXERIL) 10 MG tablet  2 times daily PRN        11/13/20 0202             11/15/20, PA-C 11/13/20 0203    Mesner, 11/15/20, MD 11/13/20 [phone]

## 2020-11-20 ENCOUNTER — Other Ambulatory Visit: Payer: Self-pay

## 2020-11-20 ENCOUNTER — Encounter: Payer: Self-pay | Admitting: Nurse Practitioner

## 2020-11-20 ENCOUNTER — Ambulatory Visit (INDEPENDENT_AMBULATORY_CARE_PROVIDER_SITE_OTHER): Payer: Managed Care, Other (non HMO) | Admitting: Nurse Practitioner

## 2020-11-20 VITALS — BP 110/80 | HR 100 | Temp 97.6°F | Ht 70.0 in | Wt 176.6 lb

## 2020-11-20 DIAGNOSIS — M7541 Impingement syndrome of right shoulder: Secondary | ICD-10-CM | POA: Diagnosis not present

## 2020-11-20 DIAGNOSIS — Z23 Encounter for immunization: Secondary | ICD-10-CM

## 2020-11-20 MED ORDER — METHYLPREDNISOLONE 4 MG PO TABS
ORAL_TABLET | ORAL | 0 refills | Status: AC
Start: 2020-11-20 — End: 2020-11-25

## 2020-11-20 NOTE — Progress Notes (Signed)
Subjective:  Patient ID: Megan Elliott, female    DOB: 06-08-1995  Age: 25 y.o. MRN: [id number]  CC: Hospitalization Follow-up (Pt went to ED for right shoulder pain that radiated down to her elbow and caused hand numbness and tingling. Pt went to urgent Ortho and she has been taking medication prescribed and she has not noticed any changes. Pt states she has missed school x 1 week due to the pain waking her up at night and making it hard to get any sleep. )  Shoulder Pain  The pain is present in the right shoulder and right arm. This is a new problem. The current episode started in the past 7 days. There has been no history of extremity trauma. The problem occurs constantly. The problem has been gradually improving. The quality of the pain is described as aching and burning. The pain is moderate. Associated symptoms include an inability to bear weight and a limited range of motion. Pertinent negatives include no fever, itching, joint locking or joint swelling. The symptoms are aggravated by activity and lying down. She has tried rest, acetaminophen, cold and NSAIDS for the symptoms. The treatment provided no relief. Family history does not include gout or rheumatoid arthritis. There is no history of diabetes, gout, osteoarthritis or rheumatoid arthritis.  Eval by ED provider and ortho Reports Difficulty with drivnig, writing, lifting. Has appt for outpt PT starting 11/27/20 Started medrol dose pack 2days ago, prescribed by ortho No improvement with oral NSAID and muscle relaxant. She was provided work note by hospital 11/12/20-11/16/20. 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  Reviewed past Medical, Social and Family history today.  Outpatient Medications Prior to Visit  Medication Sig Dispense Refill   albuterol (PROVENTIL HFA;VENTOLIN HFA) 108 (90 Base) MCG/ACT inhaler INL 2 PFS PO Q 4 TO 6 H PRF CHEST TIGHTNESS OR WHZ     cetirizine (ZYRTEC) 10 MG tablet Take 1  tablet (10 mg total) by mouth daily. 90 tablet 3   fluticasone (FLONASE) 50 MCG/ACT nasal spray Place 2 sprays into both nostrils daily. 16 g 5   ibuprofen (ADVIL) 600 MG tablet Take 1 tablet (600 mg total) by mouth every 6 (six) hours as needed. 30 tablet 0   levonorgestrel (MIRENA) 20 MCG/24HR IUD 1 each by Intrauterine route once. Inserted 12/04/2017 by [facility] 1 each 0   venlafaxine XR (EFFEXOR-XR) 75 MG 24 hr capsule TAKE 1 CAPSULE(75 MG) BY MOUTH DAILY WITH BREAKFAST 90 capsule 3   methylPREDNISolone (MEDROL DOSEPAK) 4 MG TBPK tablet Medrol (Pak) 4 mg tablets in a dose pack  Take 1 dose pk by oral route as directed.     cyclobenzaprine (FLEXERIL) 10 MG tablet Take 1 tablet (10 mg total) by mouth 2 (two) times daily as needed for muscle spasms. (Patient not taking: Reported on 11/20/2020) 10 tablet 0   diclofenac (VOLTAREN) 75 MG EC tablet Take 1 tablet (75 mg total) by mouth 2 (two) times daily. (Patient not taking: Reported on 11/20/2020) 50 tablet 2   omeprazole (PRILOSEC) 20 MG capsule Take 1 capsule (20 mg total) by mouth daily. (Patient not taking: Reported on 11/20/2020) 30 capsule 0   No facility-administered medications prior to visit.   ROS See HPI  Objective:  BP 110/80 (BP Location: Left Arm, Patient Position: Sitting, Cuff Size: Normal)   Pulse 100   Temp 97.6 F (36.4 C) (Temporal)   Ht 5\' 10"  (1.778 m)   Wt 176 lb 9.6 oz (  80.1 kg)   BMI 25.34 kg/m   Physical Exam Cardiovascular:     Rate and Rhythm: Normal rate.     Pulses: Normal pulses.  Pulmonary:     Effort: Pulmonary effort is normal.  Musculoskeletal:     Right shoulder: Tenderness present. No effusion, bony tenderness or crepitus. Decreased range of motion. Normal strength. Normal pulse.     Left shoulder: Normal.     Right upper arm: Normal.     Left upper arm: Normal.     Right elbow: Normal.     Left elbow: Normal.     Right forearm: Normal.     Left forearm: Normal.     Right wrist:  Normal.     Left wrist: Normal.     Right hand: Normal.     Left hand: Normal.     Comments: Posterior shoulder pain, active ROM but stops at 180degrees  Neurological:     Mental Status: She is alert.   Assessment & Plan:  This visit occurred during the SARS-CoV-2 public health emergency.  Safety protocols were in place, including screening questions prior to the visit, additional usage of staff PPE, and extensive cleaning of exam room while observing appropriate contact time as indicated for disinfecting solutions.   Megan Elliott was seen today for hospitalization follow-up.  Diagnoses and all orders for this visit:  Rotator cuff impingement syndrome of right shoulder -     methylPREDNISolone (MEDROL) 4 MG tablet; Take 4 tablets (16 mg total) by mouth daily for 2 days, THEN 3 tablets (12 mg total) daily for 1 day, THEN 2 tablets (8 mg total) daily for 1 day, THEN 1 tablet (4 mg total) daily for 1 day.  Flu vaccine need -     Flu Vaccine QUAD 6+ mos PF IM (Fluarix Quad PF) Maintain appt with ortho Apply cold compress as needed. Work/school note provided.  Problem List Items Addressed This Visit   None Visit Diagnoses     Rotator cuff impingement syndrome of right shoulder    -  Primary   Relevant Medications   methylPREDNISolone (MEDROL) 4 MG tablet   Flu vaccine need       Relevant Orders   Flu Vaccine QUAD 6+ mos PF IM (Fluarix Quad PF) (Completed)       Follow-up: Return if symptoms worsen or fail to improve.  [name], NP

## 2020-11-20 NOTE — Patient Instructions (Signed)
Maintain appt with ortho Apply cold compress as needed. Work/school note provided.  Rotator Cuff Tendinitis Rotator cuff tendinitis is inflammation of the tendons in the rotator cuff. Tendons are tough, cord-like bands that connect muscle to bone. The rotator cuff includes all of the muscles and tendons that connect the arm to the shoulder. The rotator cuff holds the head of the humerus, or the upper arm bone, in the cup of the shoulder blade (scapula). This condition can lead to a long-term or chronic tear. The tear may be partial or complete. What are the causes? This condition is usually caused by overusing the rotator cuff. What increases the risk? This condition is more likely to develop in athletes and workers who frequently use their shoulder or reach over their heads. This can include activities such as: Tennis. Baseball or softball. Swimming. Construction work. Painting. What are the signs or symptoms? Symptoms of this condition include: Pain that spreads (radiates) from the shoulder to the upper arm. Swelling and tenderness in front of the shoulder. Pain when reaching, pulling, or lifting the arm above the head. Pain when lowering the arm from above the head. Minor pain in the shoulder when resting. Increased pain in the shoulder at night. Difficulty placing the arm behind the back. How is this diagnosed? This condition is diagnosed with a physical exam and medical history. Tests may also be done, including: X-rays. MRI. Ultrasound. CT with or without contrast. How is this treated? Treatment for this condition depends on the severity of the condition. In less severe cases, treatment may include: Rest. This may be done with a sling that holds the shoulder still (immobilization). Your health care provider may also recommend avoiding activities that involve lifting your arm over your head. Icing the shoulder. Anti-inflammatory medicines, such as aspirin or ibuprofen. In  more severe cases, treatment may include: Physical therapy. Steroid injections. Surgery. Follow these instructions at home: If you have a sling: Wear the sling as told by your health care provider. Remove it only as told by your health care provider. Loosen it if your fingers tingle, become numb, or turn cold and blue. Keep it clean. If the sling is not waterproof: Do not let it get wet. Cover it with a watertight covering when you take a bath or shower. Managing pain, stiffness, and swelling  If directed, put ice on the injured area. To do this: If you have a removable sling, remove it as told by your health care provider. Put ice in a plastic bag. Place a towel between your skin and the bag. Leave the ice on for 20 minutes, 2-3 times a day. Move your fingers often to reduce stiffness and swelling. Raise (elevate) the injured area above the level of your heart while you are lying down. Find a comfortable sleeping position, or sleep in a recliner, if available. Activity Rest your shoulder as told by your health care provider. Ask your health care provider when it is safe to drive if you have a sling on your arm. Return to your normal activities as told by your health care provider. Ask your health care provider what activities are safe for you. Do any exercises or stretches as told by your health care provider or physical therapist. If you do repetitive overhead tasks, take small breaks in between and include stretching exercises as told by your health care provider. General instructions Do not use any products that contain nicotine or tobacco, such as cigarettes, e-cigarettes, and chewing tobacco. These can  delay healing. If you need help quitting, ask your health care provider. Take over-the-counter and prescription medicines only as told by your health care provider. Keep all follow-up visits as told by your health care provider. This is important. Contact a health care provider  if: Your pain gets worse. You have new pain in your arm, hands, or fingers. Your pain is not relieved with medicine or does not get better after 6 weeks of treatment. You have crackling sensations when moving your shoulder in certain directions. You hear a snapping sound after using your shoulder, followed by severe pain and weakness. Get help right away if: Your arm, hand, or fingers are numb or tingling. Your arm, hand, or fingers are swollen or painful or they turn white or blue. Summary Rotator cuff tendinitis is inflammation of the tendons in the rotator cuff. Tendons are tough, cord-like bands that connect muscle to bone. This condition is usually caused by overusing the rotator cuff, which includes all of the muscles and tendons that connect the arm to the shoulder. This condition is more likely to develop in athletes and workers who frequently use their shoulder or reach over their heads. Treatment generally includes rest, anti-inflammatory medicines, and icing. In some cases, physical therapy and steroid injections may be needed. In severe cases, surgery may be needed. This information is not intended to replace advice given to you by your health care provider. Make sure you discuss any questions you have with your health care provider. Document Revised: 10/19/2018 Document Reviewed: 10/19/2018 Elsevier Patient Education  2022 [employer].

## 2020-12-05 ENCOUNTER — Encounter: Payer: Self-pay | Admitting: Nurse Practitioner

## 2020-12-05 ENCOUNTER — Other Ambulatory Visit: Payer: Self-pay

## 2020-12-05 ENCOUNTER — Ambulatory Visit (INDEPENDENT_AMBULATORY_CARE_PROVIDER_SITE_OTHER): Payer: Managed Care, Other (non HMO) | Admitting: Nurse Practitioner

## 2020-12-05 VITALS — BP 114/72 | HR 78 | Temp 97.2°F | Ht 69.0 in | Wt 176.0 lb

## 2020-12-05 DIAGNOSIS — F3341 Major depressive disorder, recurrent, in partial remission: Secondary | ICD-10-CM | POA: Diagnosis not present

## 2020-12-05 DIAGNOSIS — K9049 Malabsorption due to intolerance, not elsewhere classified: Secondary | ICD-10-CM | POA: Insufficient documentation

## 2020-12-05 DIAGNOSIS — H1045 Other chronic allergic conjunctivitis: Secondary | ICD-10-CM | POA: Insufficient documentation

## 2020-12-05 DIAGNOSIS — Z0000 Encounter for general adult medical examination without abnormal findings: Secondary | ICD-10-CM

## 2020-12-05 NOTE — Patient Instructions (Addendum)
Go to lab for blood draw Schedule appt for dental cleaning Sign medical release form to get records from [place] 29-23 Years Old, Female Preventive care refers to lifestyle choices and visits with your health care provider that can promote health and wellness. Preventive care visits are also called wellness exams. What can I expect for my preventive care visit? Counseling During your preventive care visit, your health care provider may ask about your: Medical history, including: Past medical problems. Family medical history. Pregnancy history. Current health, including: Menstrual cycle. Method of birth control. Emotional well-being. Home life and relationship well-being. Sexual activity and sexual health. Lifestyle, including: Alcohol, nicotine or tobacco, and drug use. Access to firearms. Diet, exercise, and sleep habits. Work and work Statistician. Sunscreen use. Safety issues such as seatbelt and bike helmet use. Physical exam Your health care provider may check your: Height and weight. These may be used to calculate your BMI (body mass index). BMI is a measurement that tells if you are at a healthy weight. Waist circumference. This measures the distance around your waistline. This measurement also tells if you are at a healthy weight and may help predict your risk of certain diseases, such as type 2 diabetes and high blood pressure. Heart rate and blood pressure. Body temperature. Skin for abnormal spots. What immunizations do I need? Vaccines are usually given at various ages, according to a schedule. Your health care provider will recommend vaccines for you based on your age, medical history, and lifestyle or other factors, such as travel or where you work. What tests do I need? Screening Your health care provider may recommend screening tests for certain conditions. This may include: Pelvic exam and Pap test. Lipid and cholesterol levels. Diabetes screening.  This is done by checking your blood sugar (glucose) after you have not eaten for a while (fasting). Hepatitis B test. Hepatitis C test. HIV (human immunodeficiency virus) test. STI (sexually transmitted infection) testing, if you are at risk. BRCA-related cancer screening. This may be done if you have a family history of breast, ovarian, tubal, or peritoneal cancers. Talk with your health care provider about your test results, treatment options, and if necessary, the need for more tests. Follow these instructions at home: Eating and drinking  Eat a healthy diet that includes fresh fruits and vegetables, whole grains, lean protein, and low-fat dairy products. Take vitamin and mineral supplements as recommended by your health care provider. Do not drink alcohol if: Your health care provider tells you not to drink. You are pregnant, may be pregnant, or are planning to become pregnant. If you drink alcohol: Limit how much you have to 0-1 drink a day. Know how much alcohol is in your drink. In the [place] one drink equals one 12 oz bottle of beer (355 mL), one 5 oz glass of wine (148 mL), or one 1 oz glass of hard liquor (44 mL). Lifestyle Brush your teeth every morning and night with fluoride toothpaste. Floss one time each day. Exercise for at least 30 minutes 5 or more days each week. Do not use any products that contain nicotine or tobacco. These products include cigarettes, chewing tobacco, and vaping devices, such as e-cigarettes. If you need help quitting, ask your health care provider. Do not use drugs. If you are sexually active, practice safe sex. Use a condom or other form of protection to prevent STIs. If you do not wish to become pregnant, use a form of birth control. If you plan to  become pregnant, see your health care provider for a prepregnancy visit. Find healthy ways to manage stress, such as: Meditation, yoga, or listening to music. Journaling. Talking to a trusted  person. Spending time with friends and family. Minimize exposure to UV radiation to reduce your risk of skin cancer. Safety Always wear your seat belt while driving or riding in a vehicle. Do not drive: If you have been drinking alcohol. Do not ride with someone who has been drinking. If you have been using any mind-altering substances or drugs. While texting. When you are tired or distracted. Wear a helmet and other protective equipment during sports activities. If you have firearms in your house, make sure you follow all gun safety procedures. Seek help if you have been physically or sexually abused. What's next? Go to your health care provider once a year for an annual wellness visit. Ask your health care provider how often you should have your eyes and teeth checked. Stay up to date on all vaccines. This information is not intended to replace advice given to you by your health care provider. Make sure you discuss any questions you have with your health care provider. Document Revised: 07/12/2020 Document Reviewed: 07/12/2020 Elsevier Patient Education  [employer].

## 2020-12-05 NOTE — Assessment & Plan Note (Signed)
Stable mood with effexor 75mg  Denies need for CBT at this time

## 2020-12-05 NOTE — Progress Notes (Signed)
Subjective:    Patient ID: Megan Elliott, female    DOB: Oct 21, 1995, 25 y.o.   MRN: [id number]  Patient presents today for CPE   HPI Depression Stable mood with effexor 75mg  Denies need for CBT at this time  Constipation: Triggered by consumption of diary.  Vision:up to date Dental:will schedule Diet:regular. Exercise:walking Weight:  Wt Readings from Last 3 Encounters:  12/05/20 176 lb (79.8 kg)  11/20/20 176 lb 9.6 oz (80.1 kg)  11/12/20 180 lb (81.6 kg)    Sexual History (orientation,birth control, marital status, STD): no need for STD screen, reports hx of abnormal PAP, needs repeat by [facility]. Will request records.  Depression/Suicide: Depression screen [facility] 2/9 12/05/2020 11/20/2020 04/18/2020 10/19/2019 09/18/2018 03/25/2018 11/13/2017  Decreased Interest 3 1 2 1  0 1 2  Down, Depressed, Hopeless 1 1 1 2  0 1 1  PHQ - 2 Score 4 2 3 3  0 2 3  Altered sleeping 3 2 3 3 1  0 1  Tired, decreased energy 1 1 3 1 1 2 2   Change in appetite 3 0 1 1 1  0 2  Feeling bad or failure about yourself  1 3 2  0 0 1 1  Trouble concentrating 2 2 3  0 0 1 3  Moving slowly or fidgety/restless 1 0 2 0 0 0 1  Suicidal thoughts 0 1 0 0 0 0 0  PHQ-9 Score 15 11 17 8 3 6 13   Difficult doing work/chores Somewhat difficult Somewhat difficult Somewhat difficult Somewhat difficult - - -   GAD 7 : Generalized Anxiety Score 12/05/2020 11/20/2020 04/18/2020 10/19/2019  Nervous, Anxious, on Edge 1 3 2 2   Control/stop worrying 1 2 1 2   Worry too much - different things 1 2 1 3   Trouble relaxing 3 3 3 2   Restless 1 1 1  0  Easily annoyed or irritable 2 2 1 1   Afraid - awful might happen 0 1 0 0  Total GAD 7 Score 9 14 9 10   Anxiety Difficulty Somewhat difficult Somewhat difficult Somewhat difficult Very difficult   Immunizations: (TDAP, Hep C screen, Pneumovax, Influenza, zoster)  Health Maintenance  Topic Date Due   Hepatitis C Screening: USPSTF Recommendation to screen - Ages 17-79 yo.   Never done   Pap Smear  Never done   Pap Smear  Never done   Chlamydia screening  03/26/2019   COVID-19 Vaccine (4 - Booster for Pfizer series) 04/06/2020   Pneumococcal Vaccination (1 - PCV) 12/05/2021*   HPV Vaccine (1 - 2-dose series) 12/05/2021*   Tetanus Vaccine  07/16/2025   Flu Shot  Completed   HIV Screening  Completed  *Topic was postponed. The date shown is not the original due date.   Fall Risk: Fall Risk  12/05/2020 11/20/2020 06/27/2020 03/25/2018 11/13/2017  Falls in the past year? 0 0 0 0 No  Number falls in past yr: 0 0 0 - -  Injury with Fall? 0 0 0 - -  Risk for fall due to : No Fall Risks No Fall Risks No Fall Risks - -  Follow up Falls evaluation completed Falls evaluation completed Falls evaluation completed - -   Medications and allergies reviewed with patient and updated if appropriate.  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 90 tablet 3   fluticasone (FLONASE) 50 MCG/ACT nasal spray Place 2 sprays into both nostrils daily. 16 g 5   ibuprofen (ADVIL) 600 MG tablet Take 1 tablet (600 mg total) by mouth every 6 (six) hours as needed. 30 tablet 0   levonorgestrel (MIRENA) 20 MCG/24HR IUD 1 each by Intrauterine route once. Inserted 12/04/2017 by [facility] 1  each 0   venlafaxine XR (EFFEXOR-XR) 75 MG 24 hr capsule TAKE 1 CAPSULE(75 MG) BY MOUTH DAILY WITH BREAKFAST 90 capsule 3   No current facility-administered medications on file prior to visit.   Past Medical History:  Diagnosis Date   Anxiety    Asthma    Depression    Headache    UTI (urinary tract infection)    Past Surgical History:  Procedure Laterality Date   ANTERIOR CRUCIATE LIGAMENT REPAIR Left    Social History   Socioeconomic History   Marital status: Single    Spouse name: Not on file   Number of children: 0   Years of education: Not on file   Highest education level: Not on file  Occupational History    Comment: Dietitian- Elementary Education  Tobacco Use   Smoking status: Never   Smokeless tobacco: Never  Vaping Use   Vaping Use: Never used  Substance and Sexual Activity   Alcohol use: Yes   Drug use: Never   Sexual activity: Yes    Birth control/protection: Implant  Other Topics Concern   Not on file  Social History Narrative   Not on file   Social Determinants of Health   Financial Resource Strain: Not on file  Food Insecurity: Not on file  Transportation Needs: Not on file  Physical Activity: Not on file  Stress: Not on file  Social Connections: Not on file    Family History  Problem Relation Age of Onset   Hypertension Father    Diabetes Maternal Grandmother    Epilepsy Paternal Grandmother         Review of Systems  Constitutional:  Negative for fever, malaise/fatigue and weight loss.  HENT:  Negative for congestion and sore throat.   Eyes:        Negative for visual changes  Respiratory:  Negative for cough and shortness of breath.   Cardiovascular:  Negative for chest pain, palpitations and leg swelling.  Gastrointestinal:  Negative for blood in stool, constipation, diarrhea and heartburn.  Genitourinary:  Negative for dysuria, frequency and urgency.  Musculoskeletal:  Negative for falls, joint pain and myalgias.  Skin:   Negative for rash.  Neurological:  Negative for dizziness, sensory change and headaches.  Endo/Heme/Allergies:  Does not bruise/bleed easily.  Psychiatric/Behavioral:  Negative for depression, substance abuse and suicidal ideas. The patient is not nervous/anxious.    Objective:   Vitals:   12/05/20 1341  BP: 114/72  Pulse: 78  Temp: (!) 97.2 F (36.2 C)   Body mass index is 25.99 kg/m.  Physical Examination:  Physical  Exam Vitals reviewed.  Constitutional:      General: She is not in acute distress.    Appearance: She is well-developed.  HENT:     Right Ear: Tympanic membrane, ear canal and external ear normal.     Left Ear: Tympanic membrane, ear canal and external ear normal.  Eyes:     Extraocular Movements: Extraocular movements intact.     Conjunctiva/sclera: Conjunctivae normal.  Cardiovascular:     Rate and Rhythm: Normal rate and regular rhythm.     Pulses: Normal pulses.     Heart sounds: Normal heart sounds.  Pulmonary:     Effort: Pulmonary effort is normal. No respiratory distress.     Breath sounds: Normal breath sounds.  Chest:     Chest wall: No tenderness.  Abdominal:     General: Bowel sounds are normal.     Palpations: Abdomen is soft.  Genitourinary:    Comments: Deferred breast and pelvic exam to GYN Musculoskeletal:        General: Normal range of motion.     Cervical back: Normal range of motion and neck supple.     Right lower leg: No edema.     Left lower leg: No edema.  Skin:    General: Skin is warm and dry.  Neurological:     Mental Status: She is alert and oriented to person, place, and time.     Deep Tendon Reflexes: Reflexes are normal and symmetric.  Psychiatric:        Mood and Affect: Mood normal.        Behavior: Behavior normal.        Thought Content: Thought content normal.    ASSESSMENT and PLAN: This visit occurred during the SARS-CoV-2 public health emergency.  Safety protocols were in place, including screening  questions prior to the visit, additional usage of staff PPE, and extensive cleaning of exam room while observing appropriate contact time as indicated for disinfecting solutions.   Khushbu was seen today for annual exam.  Diagnoses and all orders for this visit:  Preventative health care -     CBC -     Comprehensive metabolic panel -     TSH  Recurrent major depressive disorder, in partial remission (HCC)     Problem List Items Addressed This Visit       Other   Depression    Stable mood with effexor 75mg  Denies need for CBT at this time      Other Visit Diagnoses     Preventative health care    -  Primary   Relevant Orders   CBC   Comprehensive metabolic panel   TSH       Follow up: Return in about 5 months (around 05/05/2021) for anxiety f/up.  07/05/2021, NP

## 2020-12-06 LAB — COMPREHENSIVE METABOLIC PANEL
ALT: 12 U/L (ref 0–35)
AST: 16 U/L (ref 0–37)
Albumin: 4.5 g/dL (ref 3.5–5.2)
Alkaline Phosphatase: 39 U/L (ref 39–117)
BUN: 7 mg/dL (ref 6–23)
CO2: 29 mEq/L (ref 19–32)
Calcium: 9.4 mg/dL (ref 8.4–10.5)
Chloride: 103 mEq/L (ref 96–112)
Creatinine, Ser: 0.81 mg/dL (ref 0.40–1.20)
GFR: 102.42 mL/min (ref >60.00)
Glucose, Bld: 83 mg/dL (ref 70–99)
Potassium: 4.2 mEq/L (ref 3.5–5.1)
Sodium: 139 mEq/L (ref 135–145)
Total Bilirubin: 1.4 mg/dL — ABNORMAL HIGH (ref 0.2–1.2)
Total Protein: 7.2 g/dL (ref 6.0–8.3)

## 2020-12-06 LAB — CBC
HCT: 43.6 % (ref 36.0–46.0)
Hemoglobin: 14.2 g/dL (ref 12.0–15.0)
MCHC: 32.6 g/dL (ref 30.0–36.0)
MCV: 87 fl (ref 78.0–100.0)
Platelets: 239 10*3/uL (ref 150.0–400.0)
RBC: 5.01 Mil/uL (ref 3.87–5.11)
RDW: 13.1 % (ref 11.5–15.5)
WBC: 4.7 10*3/uL (ref 4.0–10.5)

## 2020-12-06 LAB — TSH: TSH: 2.22 u[IU]/mL (ref 0.35–5.50)

## 2020-12-18 ENCOUNTER — Encounter: Payer: Self-pay | Admitting: Nurse Practitioner

## 2020-12-18 DIAGNOSIS — R87612 Low grade squamous intraepithelial lesion on cytologic smear of cervix (LGSIL): Secondary | ICD-10-CM | POA: Insufficient documentation

## 2021-01-31 LAB — HM PAP SMEAR

## 2021-05-02 ENCOUNTER — Telehealth: Payer: Self-pay | Admitting: Nurse Practitioner

## 2021-05-02 DIAGNOSIS — F331 Major depressive disorder, recurrent, moderate: Secondary | ICD-10-CM

## 2021-05-02 NOTE — Telephone Encounter (Signed)
Caller Name: HAYDON KALMAR ?Call back phone #: [phone] ? ?MEDICATION(S): venlafaxine XR (EFFEXOR-XR) 75 MG 24 hr capsule [[id number]] ? ? ?Days of Med Remaining:  ? ?Has the patient contacted their pharmacy (YES/NO)?  yes ?IF YES, when and what did the pharmacy advise? She is saying, the script was filled in [place]. And they will not fill it here. It will not transfer ?IF NO, request that the patient contact the pharmacy for the refills in the future.  ?           The pharmacy will send an electronic request (except for controlled medications). ? ?Preferred Pharmacy: [id number] [place], [place] - [address]  ?[address] [place], [place] [address]  ?Phone:  [phone]  Fax:  [phone]  ?DEA #:  [id number] ? ?~~~Please advise patient/caregiver to allow 2-3 business days to process RX refills. ? ?

## 2021-05-09 MED ORDER — VENLAFAXINE HCL ER 75 MG PO CP24: ORAL_CAPSULE | ORAL | 0 refills | Status: DC

## 2021-05-09 NOTE — Telephone Encounter (Signed)
Rx sent with no additional refills until after upcoming appointment. Pt notified. ?

## 2021-05-09 NOTE — Telephone Encounter (Signed)
Pt due for follow up, was due to follow up around 05/05/21. Please call patient and schedule appointment in order for her to get additional refills.  ?

## 2021-05-21 ENCOUNTER — Ambulatory Visit (INDEPENDENT_AMBULATORY_CARE_PROVIDER_SITE_OTHER): Payer: Managed Care, Other (non HMO) | Admitting: Nurse Practitioner

## 2021-05-21 ENCOUNTER — Encounter: Payer: Self-pay | Admitting: Nurse Practitioner

## 2021-05-21 DIAGNOSIS — F331 Major depressive disorder, recurrent, moderate: Secondary | ICD-10-CM

## 2021-05-21 MED ORDER — VENLAFAXINE HCL ER 75 MG PO CP24: ORAL_CAPSULE | ORAL | 3 refills | Status: AC

## 2021-05-21 NOTE — Patient Instructions (Signed)
Let me know when you are ready to wean off medication ?Schedule appt with therapist in [place] ?Send copy of recent PAP smear results. ? ?CONGRATULATIONS!!!!!! ?WISH YOU A SUCCESSFUL CAREER. ?

## 2021-05-21 NOTE — Assessment & Plan Note (Signed)
Stable with effexor ?Denies any adverse side effects ?Denies need for therapy sessions at this time ? ?Med refill sent, maintain dose ?F/up in 43months ?

## 2021-05-21 NOTE — Progress Notes (Signed)
? ?             Established Patient Visit ? ?Patient: Megan Elliott   DOB: 02/04/1995   25 y.o. Female  MRN: [id number] ?Visit Date: 05/21/2021 ? ?Subjective:  ?  ?Chief Complaint  ?Patient presents with  ? Follow-up  ?  F/u meds.    ? ?HPI ?Depression ?Stable with effexor ?Denies any adverse side effects ?Denies need for therapy sessions at this time ? ?Med refill sent, maintain dose ?F/up in 44months ? ? ?  05/21/2021  ? 10:53 AM 12/05/2020  ?  1:46 PM 11/20/2020  ? 11:44 AM  ?Depression screen PHQ 2/9  ?Decreased Interest 1 3 1   ?Down, Depressed, Hopeless 1 1 1   ?PHQ - 2 Score 2 4 2   ?Altered sleeping 2 3 2   ?Tired, decreased energy 0 1 1  ?Change in appetite 0 3 0  ?Feeling bad or failure about yourself  0 1 3  ?Trouble concentrating 1 2 2   ?Moving slowly or fidgety/restless 0 1 0  ?Suicidal thoughts 0 0 1  ?PHQ-9 Score 5 15 11   ?Difficult doing work/chores Somewhat difficult Somewhat difficult Somewhat difficult  ?  ? ?  05/21/2021  ? 10:53 AM 12/05/2020  ?  1:46 PM 11/20/2020  ? 11:44 AM 04/18/2020  ? 10:19 AM  ?GAD 7 : Generalized Anxiety Score  ?Nervous, Anxious, on Edge 0 1 3 2   ?Control/stop worrying 0 1 2 1   ?Worry too much - different things 1 1 2 1   ?Trouble relaxing 3 3 3 3   ?Restless 0 1 1 1   ?Easily annoyed or irritable 2 2 2 1   ?Afraid - awful might happen 0 0 1 0  ?Total GAD 7 Score 6 9 14 9   ?Anxiety Difficulty Somewhat difficult Somewhat difficult Somewhat difficult Somewhat difficult  ? ?Reviewed medical, surgical, and social history today ? ?Medications: ?Outpatient Medications Prior to Visit  ?Medication Sig  ? albuterol (PROVENTIL HFA;VENTOLIN HFA) 108 (90 Base) MCG/ACT inhaler INL 2 PFS PO Q 4 TO 6 H PRF CHEST TIGHTNESS OR WHZ  ? cetirizine (ZYRTEC) 10 MG tablet Take 1 tablet (10 mg total) by mouth daily.  ? EPINEPHrine 0.3 mg/0.3 mL IJ SOAJ injection Auvi-Q 0.3 mg/0.3 mL injection, auto-injector ? TAKE 1 (ONE) AUTO(S) AS NEEDED BY INJECTION ROUTE  ? fluticasone (FLONASE) 50 MCG/ACT nasal spray Place  2 sprays into both nostrils daily.  ? ibuprofen (ADVIL) 600 MG tablet Take 1 tablet (600 mg total) by mouth every 6 (six) hours as needed.  ? levonorgestrel (MIRENA) 20 MCG/24HR IUD 1 each by Intrauterine route once. Inserted 12/04/2017 by [facility]  ? [DISCONTINUED] venlafaxine XR (EFFEXOR-XR) 75 MG 24 hr capsule TAKE 1 CAPSULE(75 MG) BY MOUTH DAILY WITH BREAKFAST  ? ?No facility-administered medications prior to visit.  ? ?Reviewed past medical and social history.  ? ?ROS per HPI above ? ? ?   ?Objective:  ?BP 118/62   Pulse 70   Temp 97.6 ?F (36.4 ?C) (Temporal)   Ht 5\' 9"  (1.753 m)   Wt 176 lb 3.2 oz (79.9 kg)   BMI 26.02 kg/m?  ? ?  ? ?Physical Exam  ?No results found for any visits on 05/21/21. ?   ?Assessment & Plan:  ?  ?Problem List Items Addressed This Visit   ? ?  ? Other  ? Depression  ?  Stable with effexor ?Denies any adverse side effects ?Denies need for therapy sessions at this time ? ?Med refill  sent, maintain dose ?F/up in 106months ? ?  ?  ? Relevant Medications  ? venlafaxine XR (EFFEXOR-XR) 75 MG 24 hr capsule  ? ?Return in about 7 months (around 12/05/2021) for CPE (fasting). ? ?  ? ?[name], NP ? ? ?

## 2021-08-27 ENCOUNTER — Telehealth (INDEPENDENT_AMBULATORY_CARE_PROVIDER_SITE_OTHER): Payer: Self-pay | Admitting: Family

## 2021-08-27 NOTE — Telephone Encounter (Signed)
Call received from pt states she is experiencing chest pain, sob on exertion, nausea, and headache. States she had chest pain intermittently for one year but getting more frequently past two weeks. States pain is worsen than before last few days, pain described as intermittent left sided dull pain, pain scale 5/10. Also states that she has lost appetite for past year and eating only one meal per day. Reports pain radiating to left shoulder at times, and breast pain. Pt has tried Tums,and heart burn medication without benefits. Pt has h/o anxiety but no recent panic attacks as per patient. States normal periods and flow, denies h/o anemia or heart problems. Pt has h/o asthma and uses albuterol inhaler as needed, no recent asthma attacks as per pt. Reports hydrating well. Pt denies any dizziness, lightheadedness, diaphoresis, fainting, palpitations, flu or COVID like symptoms, vomiting, or abdominal pain. Advised same day appointment or urgent care. No appointment available today, advised pt to go to nearest UC for further evaluation and treatment. Advised someone else drive her to the appointment. Red flags reviewed with the pt to call 911 or to go to nearest ER. Pt verbalized understanding and agreed to plan.

## 2021-09-26 NOTE — Progress Notes (Unsigned)
[facility]                       Date of Exam: 09/27/2021 1:36 PM        Patient ID: Tina Daugherty is a 26 y.o. female.  Attending Physician: Laurence Compton, FNP        Chief Complaint:    Chief Complaint   Patient presents with    Annual Exam               HPI:    Here today for an annual physical exam. Overall feeling well at this time. Please see medical history/concerns listed below.      GENERAL HEALTH MAINTENANCE:  - Dietary habits: well balanced  - Exercise routine: walking daily  - Dental care: utd  - Sleep quality: sleeps well, getting 6-8hrs nightly  - PHQ-2 score: 0    VACCINE HISTORY:  - Tdap (every 10 years):2017  - Seasonal flu vaccine: amenable  - COVID-19 vaccine: primary series      SCREENING EXAMS:      - STD screening: declined      Cancer screening:   - Family history of cancer: no family history of colon or prostate cancer    Pap smear  -2020 normal, next 3 years.      MEDICAL PROBLEMS/COMPLAINTS:     1. Anxiety/ Depression  - taking venlafaxine 75mg  daily  - denies SE  - reports feeling stable on current medication  - reports normal appetite and sleep  - denies crying spells, HI/SI, panic sxs  - would like to discuss coming of medication              Problem List:    Patient Active Problem List   Diagnosis    Corneal ulcer    Allergic rhinitis due to pollen    Asthma    Disorder of refraction and accommodation    Exercise-induced bronchospasm    Myopia    Adverse reaction to food    IUD (intrauterine device) in place    Eczema    Depression    Chronic allergic conjunctivitis             Current Meds:    Outpatient Medications Marked as Taking for the 09/27/21 encounter (Office Visit) with 09/29/21, FNP   Medication Sig Dispense Refill    ALBUTEROL SULFATE HFA IN Inhale 2 puffs into the lungs as needed.      cetirizine (ZyrTEC) 10 MG tablet Take 1 tablet (10 mg total) by mouth daily 90 tablet 3    EPINEPHrine 0.3 MG/0.3ML Solution  Auto-injector injection 0.3 mLs (0.3 mg) once      fluticasone (FLONASE) 50 MCG/ACT nasal spray 2 sprays by Nasal route daily      levonorgestrel (Mirena, 52 MG,) 20 MCG/24HR IUD 1 Intra Uterine Device (52 mg) by Intrauterine route once      [DISCONTINUED] venlafaxine (Effexor XR) 75 MG 24 hr capsule Take 1 capsule (75 mg total) by mouth daily 90 capsule 1          Allergies:    Allergies   Allergen Reactions    Cat Hair Extract Cough, Itching and Shortness Of Breath     Cats    Horse Epithelium Shortness Of Breath    Peanut (Diagnostic) Anaphylaxis, Anxiety, Hives, Nausea Only, Shortness Of Breath and Wheezing    Mixed Grasses     Other  All nuts. And cat dander      Pollen Extract              Past Surgical History:    Past Surgical History:   Procedure Laterality Date    ANTERIOR CRUCIATE LIGAMENT REPAIR  02/10/2015    ACL Reconstruction    left acl             Family History:    Family History   Problem Relation Age of Onset    Hypertension Father     Diabetes Maternal Grandmother     Diabetes Maternal Grandfather     Seizures Paternal Grandmother     Other Paternal Grandmother         family history of stroke           Social History:    Social History     Tobacco Use    Smoking status: Never    Smokeless tobacco: Never   Vaping Use    Vaping Use: Never used   Substance Use Topics    Alcohol use: Yes     Alcohol/week: 4.0 standard drinks of alcohol     Types: 4 Glasses of wine per week    Drug use: Never           The following sections were reviewed this encounter by the provider:   Tobacco  Allergies  Meds  Problems  Med Hx  Surg Hx  Fam Hx             Vital Signs:    BP 126/86 (BP Site: Left arm, Patient Position: Sitting, Cuff Size: Medium)   Pulse 78   Temp 98.3 F (36.8 C) (Temporal)   Wt 82.3 kg (181 lb 6.4 oz)   LMP 09/11/2021 (Exact Date)   SpO2 98%   BMI 26.03 kg/m          ROS:    Review of Systems   Constitutional:  Negative for appetite change, chills, diaphoresis and fatigue.    HENT:  Negative for congestion, ear pain, hearing loss, rhinorrhea, sinus pressure, sneezing and sore throat.    Eyes:  Negative for photophobia, pain and visual disturbance.   Respiratory:  Negative for cough, chest tightness, shortness of breath and wheezing.    Cardiovascular:  Negative for chest pain.   Gastrointestinal:  Negative for abdominal pain, constipation, diarrhea, nausea and vomiting.   Genitourinary:  Negative for dysuria, flank pain and menstrual problem.   Musculoskeletal:  Negative for myalgias.   Skin:  Negative for color change, rash and wound.   Neurological:  Negative for dizziness and headaches.   Psychiatric/Behavioral:  Negative for behavioral problems. The patient is not nervous/anxious.               Physical Exam:    Physical Exam  Vitals and nursing note reviewed.   Constitutional:       Appearance: Normal appearance. She is normal weight.   HENT:      Head: Normocephalic and atraumatic.      Right Ear: Tympanic membrane and ear canal normal.      Left Ear: Tympanic membrane and ear canal normal.      Nose: Nose normal.      Mouth/Throat:      Mouth: Mucous membranes are moist.      Pharynx: Oropharynx is clear.   Eyes:      Extraocular Movements: Extraocular movements intact.      Conjunctiva/sclera: Conjunctivae normal.  Pupils: Pupils are equal, round, and reactive to light.   Cardiovascular:      Rate and Rhythm: Normal rate and regular rhythm.      Pulses: Normal pulses.      Heart sounds: No murmur heard.     No friction rub. No gallop.   Pulmonary:      Effort: Pulmonary effort is normal.      Breath sounds: Normal breath sounds. No wheezing, rhonchi or rales.   Abdominal:      General: Abdomen is flat. Bowel sounds are normal.      Palpations: Abdomen is soft.   Musculoskeletal:         General: Normal range of motion.      Cervical back: Normal range of motion and neck supple.   Skin:     General: Skin is warm and dry.      Capillary Refill: Capillary refill takes less  than 2 seconds.   Neurological:      General: No focal deficit present.      Mental Status: She is alert and oriented to person, place, and time. Mental status is at baseline.   Psychiatric:         Mood and Affect: Mood normal.         Behavior: Behavior normal.              Assessment/Plan:    1. Well adult exam  - CBC and differential  - Comprehensive metabolic panel  - Lipid panel  - TSH    - Overall feeling well at this time  - Up to date with vaccinations  - Discussed importance of vaccines  - Discussed importance of well balanced diet and regular exercise  - Encouraged healthy weight maintenance    2. Need for immunization against influenza  - Flu vaccine QUADRIVALENT (PF) 6 months and older (FLULAVAL/FLUARIX)    3. Seasonal allergic rhinitis due to pollen  - Discussed signs/symptoms of allergic rhinitis  - Avoid known triggers  - Start OTC antihistamines 2 weeks prior to allergy season  - Ok to use OTC nasal saline rinses    4. Mild intermittent asthma without complication  - Well controlled at this time  - Discussed signs/symptoms of asthma or bronchospasm including coughing fits, wheezing and shortness of breath  - Recommend avoiding known triggers    5. Depression, unspecified depression type  - venlafaxine (EFFEXOR-XR) 37.5 MG 24 hr capsule; Take 1 capsule (37.5 mg) by mouth daily  Dispense: 30 capsule; Refill: 0    - Doing very well and would like to consider coming off of medication   - Discussed titration, plan to decrease to 37.5 mg for next 1-2 weeks then stop   - S/e reviewed   - RTC for new or worsening sxs                 Follow-up:    Return in about 1 year (around 09/28/2022), or if symptoms worsen or fail to improve.         Laurence Compton, FNP

## 2021-09-27 ENCOUNTER — Ambulatory Visit (FREE_STANDING_LABORATORY_FACILITY): Payer: Commercial Managed Care - POS | Admitting: Family

## 2021-09-27 ENCOUNTER — Encounter (INDEPENDENT_AMBULATORY_CARE_PROVIDER_SITE_OTHER): Payer: Self-pay | Admitting: Family

## 2021-09-27 VITALS — BP 126/86 | HR 78 | Temp 98.3°F | Wt 181.4 lb

## 2021-09-27 DIAGNOSIS — Z23 Encounter for immunization: Secondary | ICD-10-CM

## 2021-09-27 DIAGNOSIS — Z0000 Encounter for general adult medical examination without abnormal findings: Secondary | ICD-10-CM

## 2021-09-27 DIAGNOSIS — J301 Allergic rhinitis due to pollen: Secondary | ICD-10-CM

## 2021-09-27 DIAGNOSIS — F32A Depression, unspecified: Secondary | ICD-10-CM

## 2021-09-27 DIAGNOSIS — J4520 Mild intermittent asthma, uncomplicated: Secondary | ICD-10-CM

## 2021-09-27 LAB — COMPREHENSIVE METABOLIC PANEL
ALT: 11 U/L (ref 0–55)
AST (SGOT): 18 U/L (ref 5–41)
Albumin/Globulin Ratio: 1.4 (ref 0.9–2.2)
Albumin: 4 g/dL (ref 3.5–5.0)
Alkaline Phosphatase: 41 U/L (ref 37–117)
Anion Gap: 4 — ABNORMAL LOW (ref 5.0–15.0)
BUN: 8 mg/dL (ref 7.0–21.0)
Bilirubin, Total: 1 mg/dL (ref 0.2–1.2)
CO2: 28 mEq/L (ref 17–29)
Calcium: 8.9 mg/dL (ref 8.5–10.5)
Chloride: 104 mEq/L (ref 99–111)
Creatinine: 0.8 mg/dL (ref 0.4–1.0)
Globulin: 2.8 g/dL (ref 2.0–3.6)
Glucose: 66 mg/dL — ABNORMAL LOW (ref 70–100)
Potassium: 4.3 mEq/L (ref 3.5–5.3)
Protein, Total: 6.8 g/dL (ref 6.0–8.3)
Sodium: 136 mEq/L (ref 135–145)
eGFR: >60 mL/min/{1.73_m2} (ref >=60)

## 2021-09-27 LAB — LIPID PANEL
Cholesterol / HDL Ratio: 2.6 Index
Cholesterol: 134 mg/dL (ref 0–199)
HDL: 52 mg/dL (ref 40–9999)
LDL Calculated: 73 mg/dL (ref 0–99)
Triglycerides: 43 mg/dL (ref 34–149)
VLDL Calculated: 9 mg/dL — ABNORMAL LOW (ref 10–40)

## 2021-09-27 LAB — CBC AND DIFFERENTIAL
Absolute NRBC: 0 10*3/uL (ref 0.00–0.00)
Basophils Absolute Automated: 0.02 10*3/uL (ref 0.00–0.08)
Basophils Automated: 0.4 %
Eosinophils Absolute Automated: 0 10*3/uL (ref 0.00–0.44)
Eosinophils Automated: 0 %
Hematocrit: 42.7 % (ref 34.7–43.7)
Hgb: 14 g/dL (ref 11.4–14.8)
Immature Granulocytes Absolute: 0.01 10*3/uL (ref 0.00–0.07)
Immature Granulocytes: 0.2 %
Instrument Absolute Neutrophil Count: 2.69 10*3/uL (ref 1.10–6.33)
Lymphocytes Absolute Automated: 1.39 10*3/uL (ref 0.42–3.22)
Lymphocytes Automated: 31.2 %
MCH: 28.7 pg (ref 25.1–33.5)
MCHC: 32.8 g/dL (ref 31.5–35.8)
MCV: 87.5 fL (ref 78.0–96.0)
MPV: 11.3 fL (ref 8.9–12.5)
Monocytes Absolute Automated: 0.34 10*3/uL (ref 0.21–0.85)
Monocytes: 7.6 %
Neutrophils Absolute: 2.69 10*3/uL (ref 1.10–6.33)
Neutrophils: 60.6 %
Nucleated RBC: 0 /100 WBC (ref 0.0–0.0)
Platelets: 243 10*3/uL (ref 142–346)
RBC: 4.88 10*6/uL (ref 3.90–5.10)
RDW: 13 % (ref 11–15)
WBC: 4.45 10*3/uL (ref 3.10–9.50)

## 2021-09-27 LAB — TSH: TSH: 1.33 u[IU]/mL (ref 0.35–4.94)

## 2021-09-27 LAB — HEMOLYSIS INDEX: Hemolysis Index: 12 Index (ref 0–24)

## 2021-09-27 MED ORDER — VENLAFAXINE HCL ER 37.5 MG PO CP24
37.50 mg | ORAL_CAPSULE | Freq: Every day | ORAL | 0 refills | Status: DC
Start: 2021-09-27 — End: 2021-12-26

## 2021-09-28 NOTE — Progress Notes (Signed)
Hi [place],     I have reviewed your labs, everything looks great. Your electrolytes, kidney function, liver function and blood sugar are normal. Your cholesterol levels are normal. Your thyroid function is normal. Your complete blood count is normal.    Please let me know if you have any additional questions at this time.     Take care,    Laurence Compton, FNP-C

## 2021-10-08 ENCOUNTER — Encounter (INDEPENDENT_AMBULATORY_CARE_PROVIDER_SITE_OTHER): Payer: Self-pay | Admitting: Family Medicine

## 2021-10-22 ENCOUNTER — Other Ambulatory Visit (INDEPENDENT_AMBULATORY_CARE_PROVIDER_SITE_OTHER): Payer: Self-pay | Admitting: Family

## 2021-10-23 ENCOUNTER — Other Ambulatory Visit (INDEPENDENT_AMBULATORY_CARE_PROVIDER_SITE_OTHER): Payer: Self-pay | Admitting: Family

## 2021-10-23 DIAGNOSIS — F32A Depression, unspecified: Secondary | ICD-10-CM

## 2021-10-24 NOTE — Telephone Encounter (Signed)
Plan was for her to come off the medicine - can you check in and see if she stopped or if she would like to continue. Thanks

## 2021-10-26 NOTE — Telephone Encounter (Signed)
Attempted to call pt, Lvmtcb.

## 2021-10-30 NOTE — Telephone Encounter (Signed)
Left vm x2  -LS

## 2021-11-02 ENCOUNTER — Encounter (INDEPENDENT_AMBULATORY_CARE_PROVIDER_SITE_OTHER): Payer: Self-pay

## 2021-11-02 NOTE — Telephone Encounter (Signed)
Attempted to call pt, lvmtcb

## 2021-11-05 NOTE — Telephone Encounter (Signed)
Attempted to call pt, Lvmtcb.

## 2021-12-18 ENCOUNTER — Other Ambulatory Visit (INDEPENDENT_AMBULATORY_CARE_PROVIDER_SITE_OTHER): Payer: Self-pay | Admitting: Family

## 2021-12-18 DIAGNOSIS — F32A Depression, unspecified: Secondary | ICD-10-CM

## 2021-12-21 ENCOUNTER — Encounter (INDEPENDENT_AMBULATORY_CARE_PROVIDER_SITE_OTHER): Payer: Self-pay | Admitting: Family

## 2021-12-25 ENCOUNTER — Other Ambulatory Visit (INDEPENDENT_AMBULATORY_CARE_PROVIDER_SITE_OTHER): Payer: Self-pay | Admitting: Family

## 2021-12-25 NOTE — Telephone Encounter (Signed)
Ok to send in another 30 days of 37.5 mg (which should last 2 months if taking every other day). After that she will need a f/u for additional refills. Thanks!

## 2021-12-26 ENCOUNTER — Other Ambulatory Visit (INDEPENDENT_AMBULATORY_CARE_PROVIDER_SITE_OTHER): Payer: Self-pay | Admitting: Family

## 2021-12-26 DIAGNOSIS — F32A Depression, unspecified: Secondary | ICD-10-CM

## 2021-12-26 MED ORDER — VENLAFAXINE HCL ER 37.5 MG PO CP24
37.5000 mg | ORAL_CAPSULE | Freq: Every day | ORAL | 0 refills | Status: DC
Start: 2021-12-26 — End: 2022-10-04

## 2022-01-30 ENCOUNTER — Encounter (INDEPENDENT_AMBULATORY_CARE_PROVIDER_SITE_OTHER): Payer: Self-pay | Admitting: Family

## 2022-01-30 ENCOUNTER — Ambulatory Visit (FREE_STANDING_LABORATORY_FACILITY): Payer: Commercial Managed Care - POS

## 2022-01-30 ENCOUNTER — Telehealth (INDEPENDENT_AMBULATORY_CARE_PROVIDER_SITE_OTHER): Payer: Self-pay | Admitting: Family

## 2022-01-30 VITALS — Temp 97.8°F

## 2022-01-30 DIAGNOSIS — Z111 Encounter for screening for respiratory tuberculosis: Secondary | ICD-10-CM

## 2022-01-30 NOTE — Progress Notes (Signed)
Pt presents for TB screening lab work. Order is in pt's chart.

## 2022-01-30 NOTE — Telephone Encounter (Signed)
Patient / Family member dropped off forms to complete.  Tina Daugherty has handed them off to [employer].     Please send this message back to me when the forms are completed and placed in the pick up bins in the [place]. Thanks.

## 2022-02-02 LAB — QUANTIFERON(R)-TB GOLD PLUS
Mitogen-NIL: >10 IU/mL
NIL: 0.01 IU/mL
Quantiferon TB Gold Plus: NEGATIVE
TB1-NIL: 0 IU/mL
TB2-NIL: 0 IU/mL

## 2022-02-03 ENCOUNTER — Other Ambulatory Visit (INDEPENDENT_AMBULATORY_CARE_PROVIDER_SITE_OTHER): Payer: Self-pay | Admitting: Family

## 2022-02-03 DIAGNOSIS — F32A Depression, unspecified: Secondary | ICD-10-CM

## 2022-02-04 NOTE — Telephone Encounter (Signed)
LVMTCB

## 2022-02-04 NOTE — Telephone Encounter (Signed)
Patient is due for medication follow up and/or annual physical.  Please send back if pt requires a temp refill.

## 2022-02-05 ENCOUNTER — Encounter (INDEPENDENT_AMBULATORY_CARE_PROVIDER_SITE_OTHER): Payer: Self-pay | Admitting: Family

## 2022-02-05 DIAGNOSIS — Z029 Encounter for administrative examinations, unspecified: Secondary | ICD-10-CM

## 2022-02-05 NOTE — Telephone Encounter (Signed)
Forms scanned to chart.     LVM for pt, forms at front desk

## 2022-02-05 NOTE — Telephone Encounter (Addendum)
GM immunization forms completed. Please scan and notify pt. NS2 scan divider.

## 2022-02-06 NOTE — Telephone Encounter (Signed)
Called pt. LVMTCB to schedule fu appt.

## 2022-02-12 NOTE — Progress Notes (Deleted)
[facility] [place] [facility]                       Date of Exam: 02/13/2022 2:04 PM        Patient ID: Tina Daugherty is a 27 y.o. female.  Attending Physician: Rayfield Citizen, FNP        Chief Complaint:    No chief complaint on file.              HPI:    HPI    Pt presents for med f/u:    #Depression:  - taking Effexor-XR 37.5mg  daily  - denies SE  - reports feeling stable on current medication  - reports normal appetite and sleep  - denies crying spells, HI/SI, panic sxs  - requesting refill          Problem List:    Patient Active Problem List   Diagnosis    Corneal ulcer    Allergic rhinitis due to pollen    Asthma    Disorder of refraction and accommodation    Exercise-induced bronchospasm    Myopia    Adverse reaction to food    IUD (intrauterine device) in place    Eczema    Depression    Chronic allergic conjunctivitis             Current Meds:    No outpatient medications have been marked as taking for the 02/13/22 encounter (Appointment) with Rayfield Citizen, FNP.          Allergies:    Allergies   Allergen Reactions    Cat Hair Extract Cough, Itching and Shortness Of Breath     Cats    Horse Epithelium Shortness Of Breath    Peanut (Diagnostic) Anaphylaxis, Anxiety, Hives, Nausea Only, Shortness Of Breath and Wheezing    Mixed Grasses     Other      All nuts. And cat dander      Pollen Extract              Past Surgical History:    Past Surgical History:   Procedure Laterality Date    ANTERIOR CRUCIATE LIGAMENT REPAIR  02/10/2015    ACL Reconstruction    left acl             Family History:    Family History   Problem Relation Age of Onset    Hypertension Father     Diabetes Maternal Grandmother     Diabetes Maternal Grandfather     Seizures Paternal Grandmother     Other Paternal Grandmother         family history of stroke           Social History:    Social History     Tobacco Use    Smoking status: Never    Smokeless tobacco: Never   Vaping Use    Vaping Use: Never  used   Substance Use Topics    Alcohol use: Yes     Alcohol/week: 4.0 standard drinks of alcohol     Types: 4 Glasses of wine per week    Drug use: Never           The following sections were reviewed this encounter by the provider:            Vital Signs:    There were no vitals taken for this visit.  ROS:    Review of Systems           Physical Exam:    Physical Exam         Assessment/Plan:    There are no diagnoses linked to this encounter.                Follow-up:    No follow-ups on file.         Rayfield Citizen, FNP

## 2022-02-13 ENCOUNTER — Ambulatory Visit (INDEPENDENT_AMBULATORY_CARE_PROVIDER_SITE_OTHER): Payer: Commercial Managed Care - POS | Admitting: Family

## 2022-02-13 NOTE — Progress Notes (Deleted)
[facility] [place] [facility]                       Date of Virtual Visit: 02/13/2022 1:48 PM        Patient ID: Tina Daugherty is a 27 y.o. female.  Attending Physician: Rayfield Citizen, FNP       Telemedicine Eligibility:    State Location:  [x]$    []$  [place]  []$  [place] []$  [place]  []$  Other:    Physical Location:  [x]$  Home  []$         []$        []$          []$  Other:    Patient Identity Verification:  [x]$  State Issued ID  []$  Insurance Eligibility Check  []$  Other:    Physical Address Verification: (for 911)  [x]$  Yes  []$  No    Personal identity shared with patient:  [x]$  Yes  []$  No    Education on nature of video visit shared with patient:  [x]$  Yes  []$  No    Emergency plan agreed upon with patient:  [x]$  Yes  []$  No    If the patient had not had this virtual visit, what would they have done?  []$         []$         []$        []$          []$  Other:    Visit terminated since not appropriate for virtual care:  [x]$  N/A  []$  Reason:         Chief Complaint:    No chief complaint on file.              HPI:    HPI    Pt presents for med f/u:    #Depression:  - taking Effexor-XR 37.50m daily  - denies SE  - reports feeling stable on current medication  - reports normal appetite and sleep  - denies crying spells, HI/SI, panic sxs  - requesting refill     {Vanishing Tip Click a link below to be taken to that activity or part of the chart   Chart Review  Order Review  Review [facility]  Immunizations  Allergies  Medications  Problem List  History :[id number]}       Problem List:    Patient Active Problem List   Diagnosis    Corneal ulcer    Allergic rhinitis due to pollen    Asthma    Disorder of refraction and accommodation    Exercise-induced bronchospasm    Myopia    Adverse reaction to food    IUD (intrauterine device) in place    Eczema    Depression    Chronic allergic conjunctivitis             Current Meds:    No outpatient medications  have been marked as taking for the 02/13/22 encounter (Appointment) with [place] Citizen [place]          Allergies:    Allergies   Allergen Reactions    Cat Hair Extract Cough, Itching and Shortness Of Breath     Cats    Horse Epithelium Shortness Of Breath    Peanut (Diagnostic) Anaphylaxis, Anxiety, Hives, Nausea Only, Shortness Of Breath and Wheezing    Mixed Grasses     Other      All nuts. And cat  dander      Pollen Extract              Past Surgical History:    Past Surgical History:   Procedure Laterality Date    ANTERIOR CRUCIATE LIGAMENT REPAIR  02/10/2015    ACL Reconstruction    left acl             Family History:    Family History   Problem Relation Age of Onset    Hypertension Father     Diabetes Maternal Grandmother     Diabetes Maternal Grandfather     Seizures Paternal Grandmother     Other Paternal Grandmother         family history of stroke           Social History:    Social History     Tobacco Use    Smoking status: Never    Smokeless tobacco: Never   Vaping Use    Vaping Use: Never used   Substance Use Topics    Alcohol use: Yes     Alcohol/week: 4.0 standard drinks of alcohol     Types: 4 Glasses of wine per week    Drug use: Never           The following sections were reviewed this encounter by the provider:            Vital Signs:    There were no vitals taken for this visit.         ROS:    Review of Systems           Physical Exam:    Physical Exam   GENERAL APPEARANCE: alert, in no acute distress, pleasant, well nourished.   HEAD: normal appearance  EYES: no discharge  EARS: normal hearing  NECK/THYROID: appearance -supple  PSYCH: appropriate affect, appropriate mood, normal speech, normal attention        Assessment:    There are no diagnoses linked to this encounter.          Plan:                Follow-up:    No follow-ups on file.         Rayfield Citizen, FNP

## 2022-02-18 ENCOUNTER — Encounter (INDEPENDENT_AMBULATORY_CARE_PROVIDER_SITE_OTHER): Payer: Self-pay | Admitting: Family

## 2022-02-18 ENCOUNTER — Telehealth (INDEPENDENT_AMBULATORY_CARE_PROVIDER_SITE_OTHER): Payer: Commercial Managed Care - POS | Admitting: Family

## 2022-02-18 DIAGNOSIS — F419 Anxiety disorder, unspecified: Secondary | ICD-10-CM

## 2022-02-18 DIAGNOSIS — F32A Depression, unspecified: Secondary | ICD-10-CM

## 2022-02-18 MED ORDER — PROPRANOLOL HCL 10 MG PO TABS
10.0000 mg | ORAL_TABLET | Freq: Two times a day (BID) | ORAL | 0 refills | Status: DC | PRN
Start: 2022-02-18 — End: 2022-10-04

## 2022-02-18 NOTE — Progress Notes (Signed)
[facility] - [facility]  [facility]                       Date of Virtual Visit: 02/18/2022 10:24 AM        Patient ID: Tina Daugherty is a 27 y.o. female.  Attending Physician: Rayfield Citizen, FNP       Telemedicine Eligibility:    State Location:  [x]  [place]  []  [place]  []  [place] []  [place]  []  Other:    Physical Location:  [x]  Home  []         []        []          []  Other:    Patient Identity Verification:  [x]  State Issued ID  []  Insurance Eligibility Check  []  Other:    Physical Address Verification: (for 911)  [x]  Yes  []  No    Personal identity shared with patient:  [x]  Yes  []  No    Education on nature of video visit shared with patient:  [x]  Yes  []  No    Emergency plan agreed upon with patient:  [x]  Yes  []  No    If the patient had not had this virtual visit, what would they have done?  []         []         []        []          []  Other:    Visit terminated since not appropriate for virtual care:  [x]  N/A  []  Reason:         Chief Complaint:    Chief Complaint   Patient presents with    Depression               HPI:    HPI    Pt presents for med f/u:    #Depression:  - taking Effexor-XR 37.5 mg once every 3 days   - denies SE  - reports feeling stable on current medication  - reports normal appetite and sleep  - reports some increased agitation   - denies crying spells, SI, panic sxs  - does not currently see therapist            Problem List:    Patient Active Problem List   Diagnosis    Corneal ulcer    Allergic rhinitis due to pollen    Asthma    Disorder of refraction and accommodation    Exercise-induced bronchospasm    Myopia    Adverse reaction to food    IUD (intrauterine device) in place    Eczema    Depression    Chronic allergic conjunctivitis             Current Meds:    Outpatient Medications Marked as Taking for the 02/18/22 encounter (Telemedicine Visit) with Rayfield Citizen, FNP   Medication Sig Dispense Refill    ALBUTEROL SULFATE HFA IN  Inhale 2 puffs into the lungs as needed.      cetirizine (ZyrTEC) 10 MG tablet Take 1 tablet (10 mg total) by mouth daily 90 tablet 3    EPINEPHrine 0.3 MG/0.3ML Solution Auto-injector injection 0.3 mLs (0.3 mg) once      fluticasone (FLONASE) 50 MCG/ACT nasal spray 2 sprays by Nasal route daily      levonorgestrel (Mirena, 52 MG,) 20 MCG/24HR IUD 1 Intra Uterine Device (52 mg) by Intrauterine route once  venlafaxine (EFFEXOR-XR) 37.5 MG 24 hr capsule Take 1 capsule (37.5 mg) by mouth daily 30 capsule 0          Allergies:    Allergies   Allergen Reactions    Cat Hair Extract Cough, Itching and Shortness Of Breath     Cats    Horse Epithelium Shortness Of Breath    Peanut (Diagnostic) Anaphylaxis, Anxiety, Hives, Nausea Only, Shortness Of Breath and Wheezing    Mixed Grasses     Other      All nuts. And cat dander      Pollen Extract              Past Surgical History:    Past Surgical History:   Procedure Laterality Date    ANTERIOR CRUCIATE LIGAMENT REPAIR  02/10/2015    ACL Reconstruction    left acl             Family History:    Family History   Problem Relation Age of Onset    Hypertension Father     Diabetes Maternal Grandmother     Diabetes Maternal Grandfather     Seizures Paternal Grandmother     Other Paternal Grandmother         family history of stroke           Social History:    Social History     Tobacco Use    Smoking status: Never    Smokeless tobacco: Never   Vaping Use    Vaping Use: Never used   Substance Use Topics    Alcohol use: Yes     Alcohol/week: 4.0 standard drinks of alcohol     Types: 4 Glasses of wine per week    Drug use: Never           The following sections were reviewed this encounter by the provider:   Tobacco  Allergies  Meds  Problems  Med Hx  Surg Hx  Fam Hx             Vital Signs:    There were no vitals taken for this visit.         ROS:    Review of Systems   Constitutional:  Negative for appetite change, chills, diaphoresis and fatigue.   HENT:  Negative for  congestion, ear pain, hearing loss, rhinorrhea, sinus pressure, sneezing and sore throat.    Eyes:  Negative for photophobia, pain and visual disturbance.   Respiratory:  Negative for cough, chest tightness, shortness of breath and wheezing.    Cardiovascular:  Negative for chest pain.   Gastrointestinal:  Negative for abdominal pain, constipation, diarrhea, nausea and vomiting.   Genitourinary:  Negative for dysuria, flank pain and menstrual problem.   Musculoskeletal:  Negative for myalgias.   Skin:  Negative for color change, rash and wound.   Neurological:  Negative for dizziness and headaches.   Psychiatric/Behavioral:  Negative for behavioral problems. The patient is not nervous/anxious.               Physical Exam:    Physical Exam   GENERAL APPEARANCE: alert, in no acute distress, pleasant, well nourished.   HEAD: normal appearance  EYES: no discharge  EARS: normal hearing  NECK/THYROID: appearance -supple  PSYCH: appropriate affect, appropriate mood, normal speech, normal attention        Assessment:    1. Depression, unspecified depression type    2. Anxiety  - propranolol (INDERAL) 10 MG  tablet; Take 1 tablet (10 mg) by mouth 2 (two) times daily as needed (anxiety)  Dispense: 30 tablet; Refill: 0            Plan:      - Well controlled at this time. She is taking Effexor every few days. We discussed either stopping altogether or increasing back up to daily use. She would like to trial off of medication   - Will trial propranolol for prn anxiety. She will message me in 2-3 weeks to see how it is working  - Discussed benefits of regular exercise, well balanced diet and importance of sleep hygiene          Follow-up:    Return in about 6 months (around 08/19/2022), or if symptoms worsen or fail to improve.         Rayfield Citizen, FNP

## 2022-02-23 ENCOUNTER — Encounter (INDEPENDENT_AMBULATORY_CARE_PROVIDER_SITE_OTHER): Payer: Self-pay | Admitting: Family

## 2022-02-24 IMAGING — CR DG SHOULDER 2+V*R*
3 series · 3 of 3 positions shown · non-contrast
Comparison: None.

CLINICAL DATA: Right-sided shoulder pain, no known injury, initial
encounter

EXAM:
RIGHT SHOULDER - 2+ VIEW

[w shoulder external right]
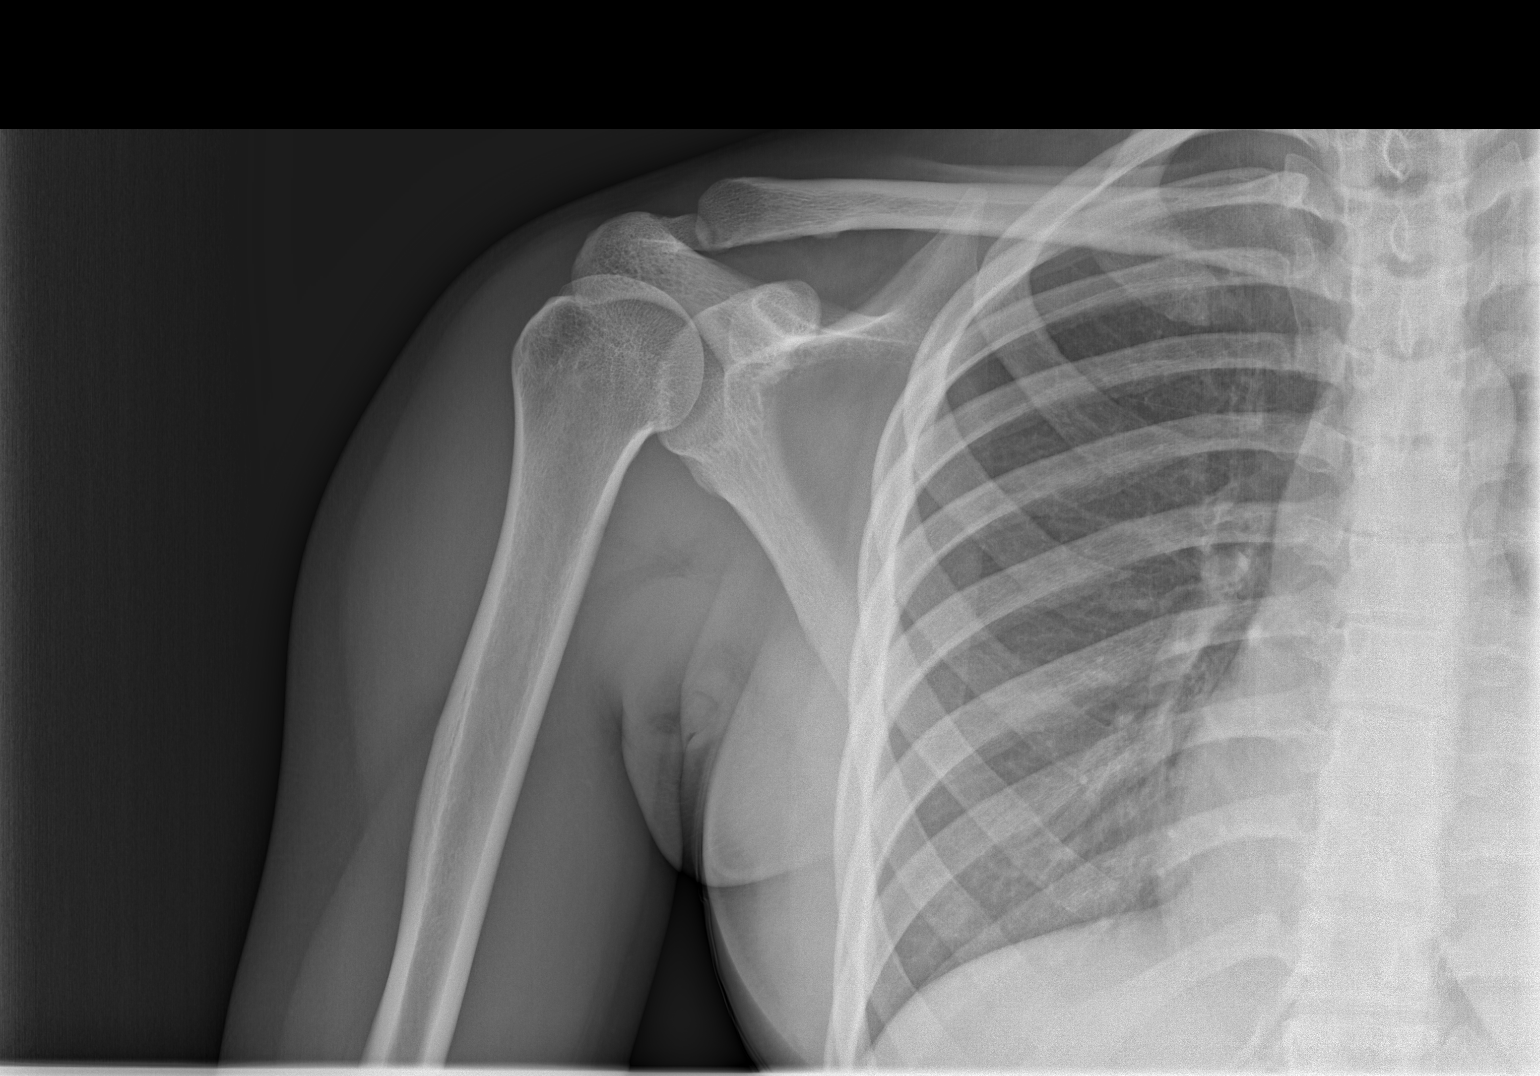

[w shoulder y-view right]
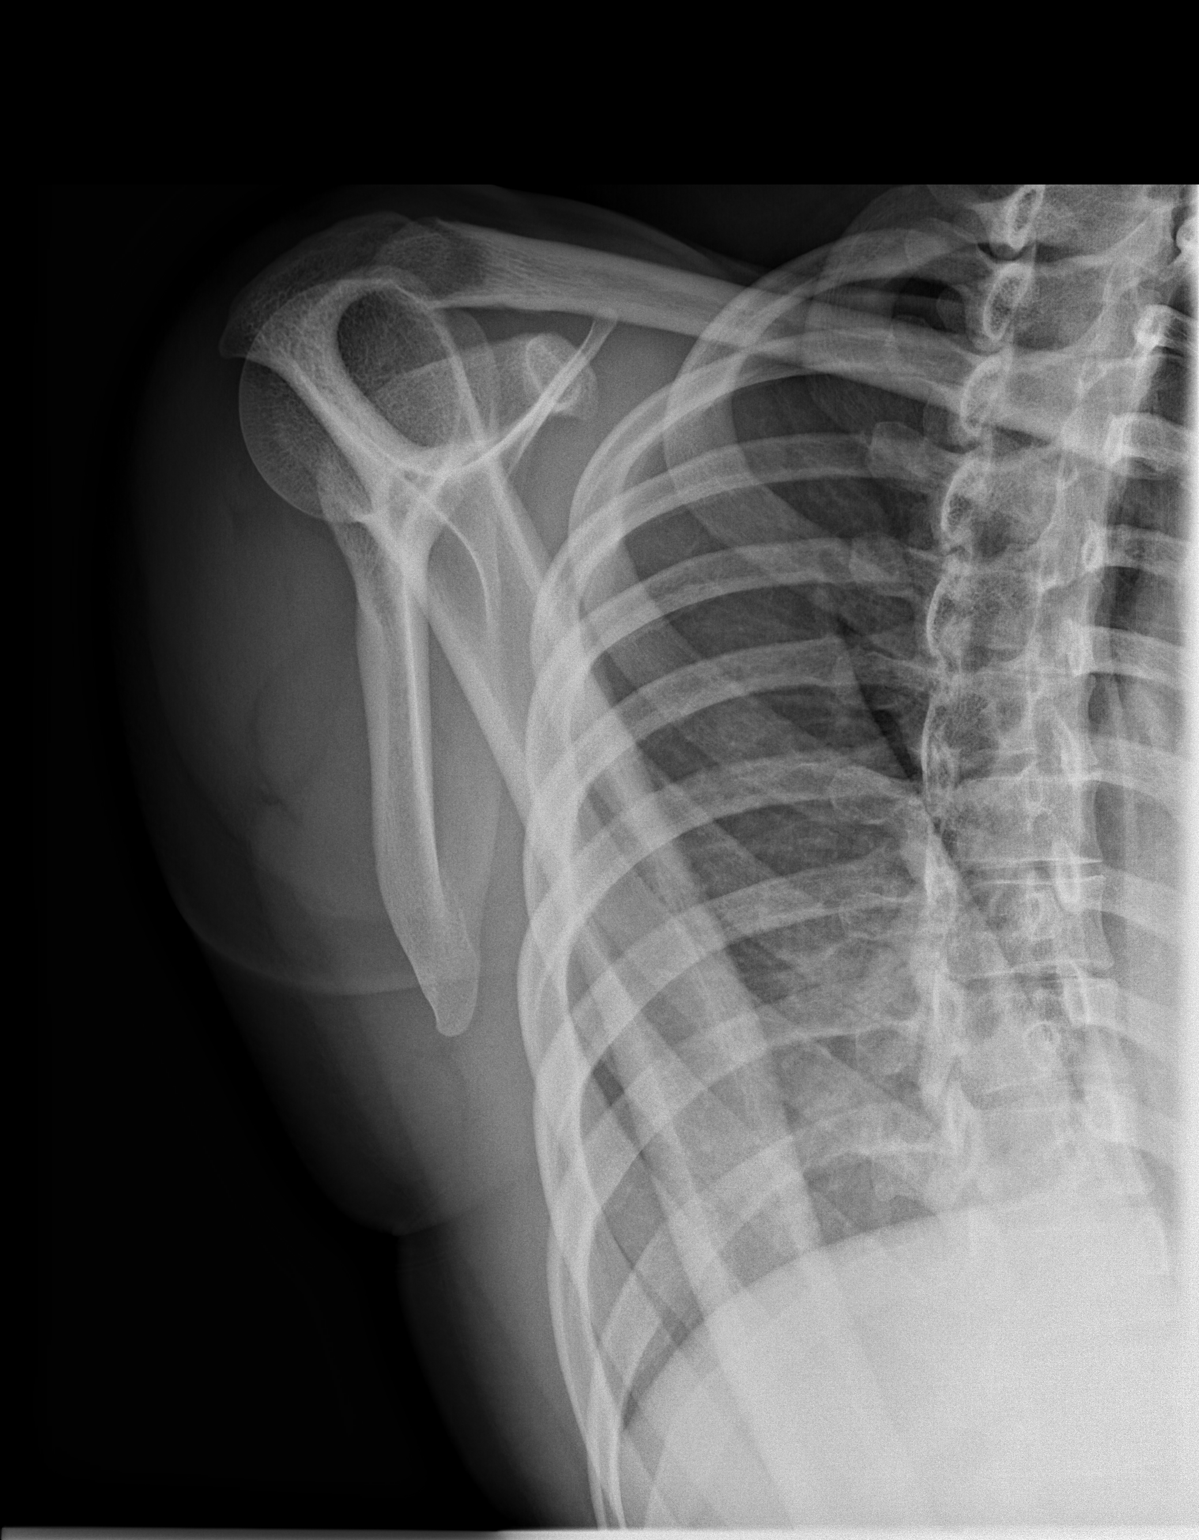

[x shoulder axillary right]
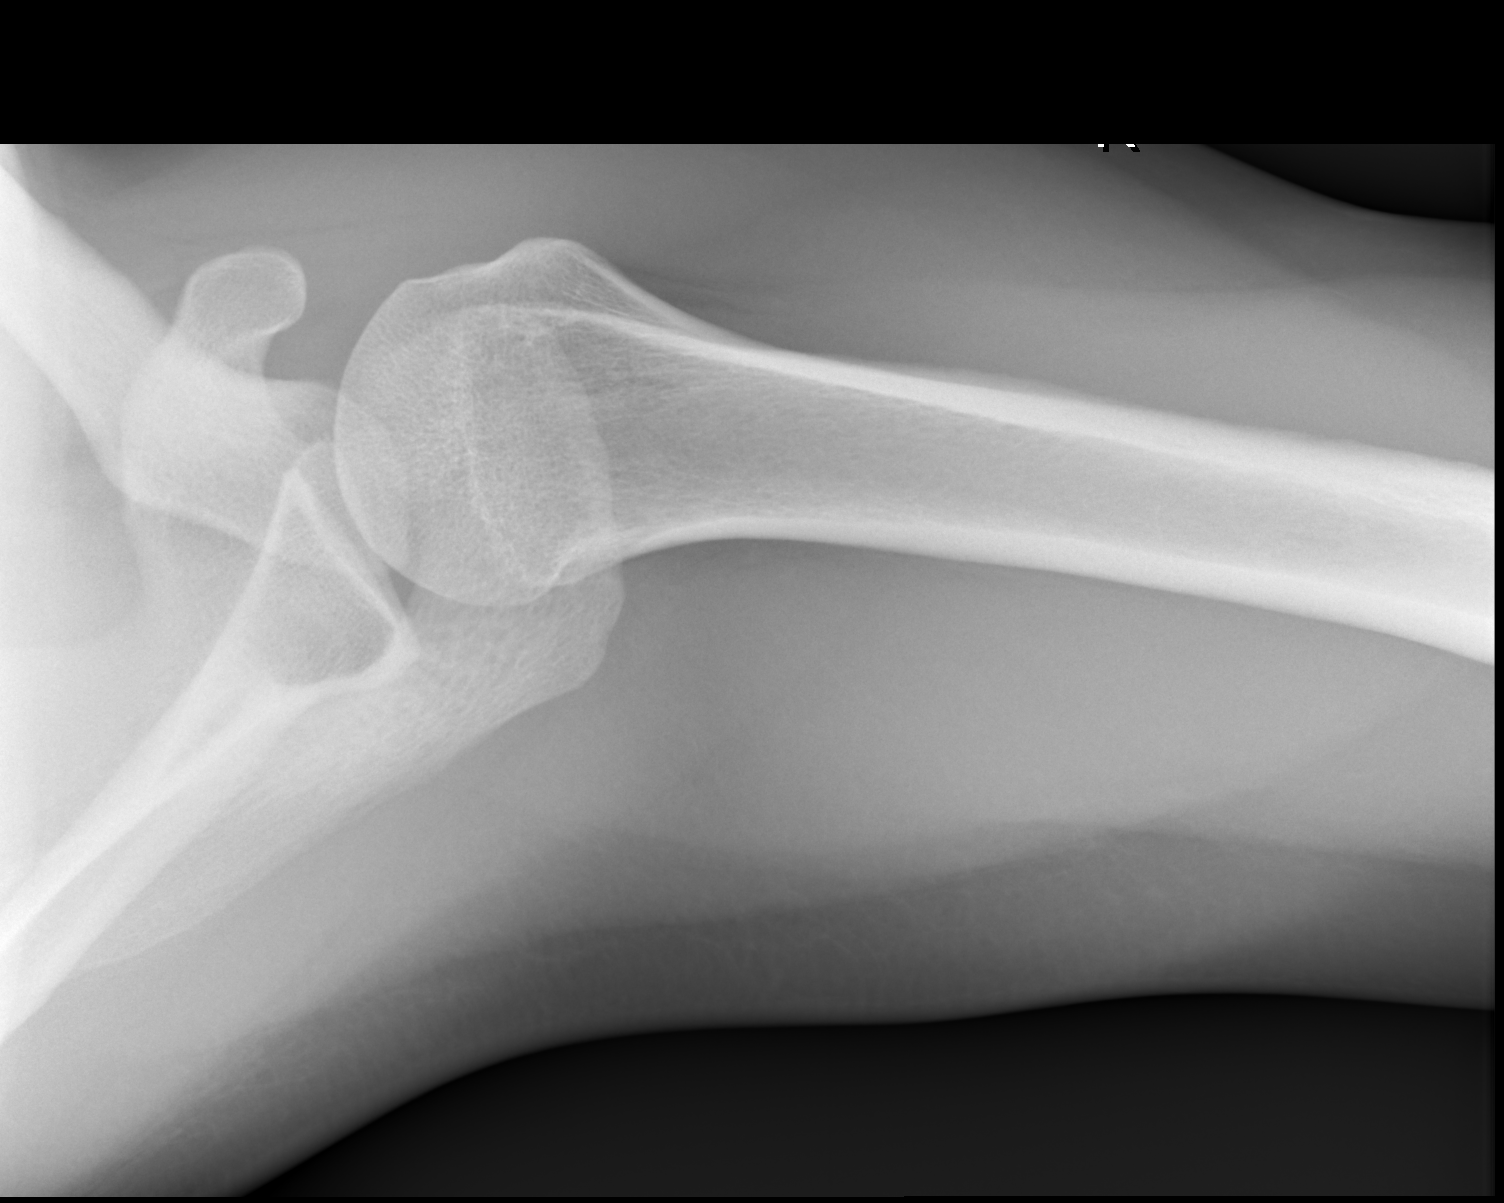

[3 of 3 positions shown; findings below may reference images not displayed]

FINDINGS: There is no evidence of fracture or dislocation. There is no
evidence of arthropathy or other focal bone abnormality. Soft
tissues are unremarkable.
IMPRESSION: No acute abnormality noted.

## 2022-03-05 ENCOUNTER — Other Ambulatory Visit (INDEPENDENT_AMBULATORY_CARE_PROVIDER_SITE_OTHER): Payer: Self-pay | Admitting: Family

## 2022-03-05 DIAGNOSIS — F419 Anxiety disorder, unspecified: Secondary | ICD-10-CM

## 2022-10-03 NOTE — Progress Notes (Signed)
[facility] [place] [facility]            Date of Exam: 10/04/2022 8:30 AM   Patient ID: Tina Daugherty is a 26 y.o. female.  Attending Physician: Laurence Compton, FNP     CC   Chief Complaint   Patient presents with    Annual Exam        HPI   HPI    Here today for her annual physical exam. She is overall feeling well at this time. Please see medical history/concerns below.     GENERAL HEALTH MAINTENANCE:  - Dietary habits: fast food, carbs, fruits, veggies, salads  - Exercise routine: walking, coaching softball team- 2 x weekly  - Dental care: utd  - Sleep quality: sleeps ok, getting 7 hours nightly  - Employment status: student  - PHQ-2 score: 2    VACCINE HISTORY:  - Tdap (every 10 years):2017  - Seasonal flu vaccine: agreeable  - COVID-19 vaccine:utd    Under age 35: (Please document something)  - HPV vaccine: due      SCREENING EXAMS:      - STD screening: declined  - Hep C screening: declined    - Family history of cancer: no family history of breast, skin or colon cancer    After age 69:  - Pap smear: 02/01/2021-  NEGATIVE FOR INTRAEPITHELIAL LESION OR MALIGNANCY       After age 77:   - Skin cancer screening: no concerns      MEDICAL HISTORY/CONCERNS:           PROBLEM LIST   Problem List[1]     CURRENT MEDICATIONS   Medications Taking[2]     ALLERGIES   Allergies[3]     PAST SURGICAL HISTORY   Past Surgical History:   Procedure Laterality Date    ANTERIOR CRUCIATE LIGAMENT REPAIR  02/10/2015    ACL Reconstruction    left acl          FAMILY HISTORY   Family History   Problem Relation Age of Onset    Hypertension Father     Diabetes Maternal Grandmother     Diabetes Maternal Grandfather     Seizures Paternal Grandmother     Other Paternal Grandmother         family history of stroke         SOCIAL HISTORY   Social History[4]     The following sections were reviewed this encounter by the provider:   Tobacco  Allergies  Meds  Problems  Med Hx  Surg Hx  Fam Hx          ROS   Review of Systems    Constitutional:  Negative for appetite change, chills, diaphoresis and fatigue.   HENT:  Negative for congestion, ear pain, hearing loss, rhinorrhea, sinus pressure, sneezing and sore throat.    Eyes:  Negative for photophobia, pain and visual disturbance.   Respiratory:  Negative for cough, chest tightness, shortness of breath and wheezing.    Cardiovascular:  Negative for chest pain.   Gastrointestinal:  Negative for abdominal pain, constipation, diarrhea, nausea and vomiting.   Genitourinary:  Negative for dysuria, flank pain and menstrual problem.   Musculoskeletal:  Negative for myalgias.   Skin:  Negative for color change, rash and wound.   Neurological:  Negative for dizziness and headaches.   Psychiatric/Behavioral:  Negative for behavioral problems. The patient is not nervous/anxious.  Vital Signs   BP 118/82 (BP Site: Left arm, Patient Position: Sitting, Cuff Size: Medium)   Pulse 90   Temp 98 F (36.7 C) (Temporal)   Wt 81.3 kg (179 lb 3.2 oz)   SpO2 98%   BMI 25.71 kg/m      Physical Exam   Physical Exam  Vitals and nursing note reviewed.   Constitutional:       Appearance: Normal appearance. She is normal weight.   HENT:      Head: Normocephalic and atraumatic.      Right Ear: Tympanic membrane and ear canal normal.      Left Ear: Tympanic membrane and ear canal normal.      Nose: Nose normal.      Mouth/Throat:      Mouth: Mucous membranes are moist.      Pharynx: Oropharynx is clear.   Eyes:      Extraocular Movements: Extraocular movements intact.      Conjunctiva/sclera: Conjunctivae normal.      Pupils: Pupils are equal, round, and reactive to light.   Cardiovascular:      Rate and Rhythm: Normal rate and regular rhythm.      Pulses: Normal pulses.      Heart sounds: No murmur heard.     No friction rub. No gallop.   Pulmonary:      Effort: Pulmonary effort is normal.      Breath sounds: Normal breath sounds. No wheezing, rhonchi or rales.   Abdominal:      General: Abdomen is  flat. Bowel sounds are normal.      Palpations: Abdomen is soft.   Musculoskeletal:         General: Normal range of motion.      Cervical back: Normal range of motion and neck supple.   Skin:     General: Skin is warm and dry.      Capillary Refill: Capillary refill takes less than 2 seconds.   Neurological:      General: No focal deficit present.      Mental Status: She is alert and oriented to person, place, and time. Mental status is at baseline.   Psychiatric:         Mood and Affect: Mood normal.         Behavior: Behavior normal.           Assessment/Plan     Assessment & Plan  Well adult exam  - Overall feeling well at this time  - Declines HPV vaccine today   - Discussed importance of vaccines  - Discussed importance of well balanced diet and regular exercise  - Encouraged healthy weight maintenance    Orders:    CBC with Differential (Order); Future    Comprehensive Metabolic Panel; Future    Lipid Panel; Future    TSH; Future    Immunization due    Orders:    Flu vaccine, TRIVALENT, 6 months and older (FLUARIX/FLULAVAL/FLUZONE), single-dose PF, 0.5 mL    Encounter for hepatitis C screening test for low risk patient    Orders:    Hepatitis C Antibody, Total (Order); Future         FOLLOW-UP   Return in about 1 year (around 10/04/2023), or if symptoms worsen or fail to improve.     Signed: Laurence Compton, FNP         [1]   Patient Active Problem List  Diagnosis    Corneal ulcer  Allergic rhinitis due to pollen    Asthma    Disorder of refraction and accommodation    Exercise-induced bronchospasm    Myopia    Adverse reaction to food    IUD (intrauterine device) in place    Eczema    Depression    Chronic allergic conjunctivitis   [2]   Outpatient Medications Marked as Taking for the 10/04/22 encounter (Office Visit) with Laurence Compton, FNP   Medication Sig Dispense Refill    ALBUTEROL SULFATE HFA IN Inhale 2 puffs into the lungs as needed.      EPINEPHrine 0.3 MG/0.3ML Solution Auto-injector injection 0.3 mLs  (0.3 mg) once      fexofenadine (ALLEGRA) 180 MG tablet Take 1 tablet (180 mg) by mouth daily      levonorgestrel (Mirena, 52 MG,) 20 MCG/24HR IUD 1 Intra Uterine Device (52 mg) by Intrauterine route once     [3]   Allergies  Allergen Reactions    Cat Hair Extract Cough, Itching and Shortness Of Breath     Cats    Horse Epithelium Shortness Of Breath    Peanut (Diagnostic) Anaphylaxis, Anxiety, Hives, Nausea Only, Shortness Of Breath and Wheezing    Mixed Grasses     Other      All nuts. And cat dander      Pollen Extract    [4]   Social History  Tobacco Use    Smoking status: Never    Smokeless tobacco: Never   Vaping Use    Vaping status: Never Used   Substance Use Topics    Alcohol use: Yes     Alcohol/week: 4.0 standard drinks of alcohol     Types: 4 Glasses of wine per week    Drug use: Never

## 2022-10-04 ENCOUNTER — Encounter (INDEPENDENT_AMBULATORY_CARE_PROVIDER_SITE_OTHER): Payer: Self-pay | Admitting: Family

## 2022-10-04 ENCOUNTER — Ambulatory Visit (FREE_STANDING_LABORATORY_FACILITY): Payer: Commercial Managed Care - POS | Admitting: Family

## 2022-10-04 VITALS — BP 118/82 | HR 90 | Temp 98.0°F | Wt 179.2 lb

## 2022-10-04 DIAGNOSIS — Z23 Encounter for immunization: Secondary | ICD-10-CM

## 2022-10-04 DIAGNOSIS — Z1159 Encounter for screening for other viral diseases: Secondary | ICD-10-CM

## 2022-10-04 DIAGNOSIS — Z0000 Encounter for general adult medical examination without abnormal findings: Secondary | ICD-10-CM

## 2022-10-04 LAB — LAB USE ONLY - CBC WITH DIFFERENTIAL
Absolute Basophils: 0.03 10*3/uL (ref 0.00–0.08)
Absolute Eosinophils: 0.01 10*3/uL (ref 0.00–0.44)
Absolute Immature Granulocytes: 0.02 10*3/uL (ref 0.00–0.07)
Absolute Lymphocytes: 1.98 10*3/uL (ref 0.42–3.22)
Absolute Monocytes: 0.5 10*3/uL (ref 0.21–0.85)
Absolute Neutrophils: 4.16 10*3/uL (ref 1.10–6.33)
Absolute nRBC: 0 10*3/uL (ref <=0.00)
Basophils %: 0.4 %
Eosinophils %: 0.1 %
Hematocrit: 41.8 % (ref 34.7–43.7)
Hemoglobin: 13.6 g/dL (ref 11.4–14.8)
Immature Granulocytes %: 0.3 %
Lymphocytes %: 29.6 %
MCH: 28.8 pg (ref 25.1–33.5)
MCHC: 32.5 g/dL (ref 31.5–35.8)
MCV: 88.6 fL (ref 78.0–96.0)
MPV: 11.4 fL (ref 8.9–12.5)
Monocytes %: 7.5 %
Neutrophils %: 62.1 %
Platelet Count: 257 10*3/uL (ref 142–346)
Preliminary Absolute Neutrophil Count: 4.16 10*3/uL (ref 1.10–6.33)
RBC: 4.72 10*6/uL (ref 3.90–5.10)
RDW: 13 % (ref 11–15)
WBC: 6.7 10*3/uL (ref 3.10–9.50)
nRBC %: 0 /100{WBCs} (ref <=0.0)

## 2022-10-04 LAB — COMPREHENSIVE METABOLIC PANEL
ALT: 10 U/L (ref 0–55)
AST (SGOT): 16 U/L (ref 5–41)
Albumin/Globulin Ratio: 1.4 (ref 0.9–2.2)
Albumin: 4.2 g/dL (ref 3.5–5.0)
Alkaline Phosphatase: 39 U/L (ref 37–117)
Anion Gap: 9 (ref 5.0–15.0)
BUN: 10 mg/dL (ref 7–21)
Bilirubin, Total: 1.9 mg/dL — ABNORMAL HIGH (ref 0.2–1.2)
CO2: 21 meq/L (ref 17–29)
Calcium: 9.3 mg/dL (ref 8.5–10.5)
Chloride: 108 meq/L (ref 99–111)
Creatinine: 0.7 mg/dL (ref 0.4–1.0)
GFR: >60 mL/min/{1.73_m2} (ref >=60.0)
Globulin: 3 g/dL (ref 2.0–3.6)
Glucose: 79 mg/dL (ref 70–100)
Hemolysis Index: 16 {index}
Potassium: 4.2 meq/L (ref 3.5–5.3)
Protein, Total: 7.2 g/dL (ref 6.0–8.3)
Sodium: 138 meq/L (ref 135–145)

## 2022-10-04 LAB — LAB USE ONLY - GOLD SST HOLD TUBE

## 2022-10-04 LAB — LIPID PANEL
Cholesterol / HDL Ratio: 2.8 {index}
Cholesterol: 144 mg/dL (ref <=199)
HDL: 51 mg/dL (ref >=40)
LDL Calculated: 78 mg/dL (ref 0–99)
Triglycerides: 77 mg/dL (ref 34–149)
VLDL Calculated: 15 mg/dL (ref 10–40)

## 2022-10-04 LAB — TSH: TSH: 2.29 u[IU]/mL (ref 0.35–4.94)

## 2022-10-04 LAB — LAB USE ONLY - HEPATITIS C ANTIBODY, TOTAL: Hepatitis C Antibody: NONREACTIVE

## 2022-10-08 NOTE — Progress Notes (Signed)
Hi Tina Daugherty,     I have reviewed your labs. Your electrolytes, kidney function, liver function and blood sugar are normal.     Your thyroid function is normal.     Your cholesterol levels are excellent.     Your complete blood count is normal.    Please let me know if you have any additional questions at this time.     Take care,    Laurence Compton, FNP-C

## 2023-04-28 ENCOUNTER — Telehealth: Payer: Self-pay | Admitting: Nurse Practitioner

## 2023-04-28 NOTE — Telephone Encounter (Signed)
 Lvmtcb if needing appt.

## 2023-09-11 ENCOUNTER — Encounter (INDEPENDENT_AMBULATORY_CARE_PROVIDER_SITE_OTHER): Payer: Self-pay

## 2023-09-23 ENCOUNTER — Encounter (INDEPENDENT_AMBULATORY_CARE_PROVIDER_SITE_OTHER): Payer: Self-pay

## 2023-10-10 ENCOUNTER — Encounter (INDEPENDENT_AMBULATORY_CARE_PROVIDER_SITE_OTHER): Payer: Commercial Managed Care - POS | Admitting: Family

## 2023-10-11 ENCOUNTER — Ambulatory Visit (INDEPENDENT_AMBULATORY_CARE_PROVIDER_SITE_OTHER): Admitting: Physician Assistant

## 2023-10-11 ENCOUNTER — Ambulatory Visit (INDEPENDENT_AMBULATORY_CARE_PROVIDER_SITE_OTHER)

## 2023-10-11 ENCOUNTER — Encounter (INDEPENDENT_AMBULATORY_CARE_PROVIDER_SITE_OTHER): Payer: Self-pay

## 2023-10-11 VITALS — BP 149/83 | HR 73 | Temp 98.0°F | Resp 20 | Ht 70.0 in | Wt 176.0 lb

## 2023-10-11 DIAGNOSIS — M25561 Pain in right knee: Secondary | ICD-10-CM

## 2023-10-11 DIAGNOSIS — S86911A Strain of unspecified muscle(s) and tendon(s) at lower leg level, right leg, initial encounter: Secondary | ICD-10-CM

## 2023-10-11 MED ORDER — IBUPROFEN 200 MG PO TABS
600.0000 mg | ORAL_TABLET | Freq: Once | ORAL | Status: AC
Start: 2023-10-11 — End: 2023-10-11
  Administered 2023-10-11: 600 mg via ORAL

## 2023-10-11 NOTE — Patient Instructions (Signed)
 You are being treated for: right knee strain   Helpful over the counter medicine you can use:Alternate between tylenol  and ibuprofen  for fever/body aches every 3 hours  Ibuprofen : 800 mg every 6 to 8 hours; maximum dose: 3.2 g/day   Tylenol : 325 to 650 mg every 4 to 6 hours as needed or 1 g every 6 hours as needed; maximum dose: 4 g/day   For follow up you should  rest/ice/elevate knee support until pain resolves

## 2023-10-11 NOTE — Progress Notes (Signed)
 [facility]  OFFICE NOTE         Subjective   Historian: Patient      Chief Complaint   Patient presents with    Knee Pain     Pt is here c/o right knee pain after a activity today        Knee Pain       Tina Daugherty is a 28 y.o. female who presents for pain in her right knee after PTA she was exercising and twisted her right knee and felt like her patella dislocated and then went back into proper position. She now has pain, worse when twisting. Pt able to ambulate. No injury right hip and right ankle.     History:  Medications and Allergies reviewed.   Pertinent Past Medical, Surgical, Family and Social History were reviewed.        Objective     Vitals:    10/11/23 1608   BP: 149/83   BP Site: Right arm   Patient Position: Sitting   Cuff Size: Medium   Pulse: 73   Resp: 20   Temp: 98 F (36.7 C)   TempSrc: Oral   SpO2: 98%   Weight: 79.8 kg (176 lb)   Height: 1.778 m (5' 10)     Physical Exam  Musculoskeletal:      Right knee: No swelling, deformity, effusion, erythema, ecchymosis, lacerations, bony tenderness or crepitus. Normal range of motion. Tenderness present over the lateral joint line. No medial joint line, MCL, LCL, ACL, PCL or patellar tendon tenderness. No LCL laxity, MCL laxity, ACL laxity or PCL laxity. Normal alignment, normal meniscus and normal patellar mobility. Normal pulse.      Right ankle: Normal.        Legs:      Urgent Care Course   There were no labs reviewed with this patient during the visit.    There were no x-rays reviewed with this patient during the visit.    Administrations This Visit       ibuprofen  (ADVIL ) tablet 600 mg       Admin Date  10/11/2023 Action  Given Dose  600 mg Route  Oral Documented By  Particia Neptune, MA                  Procedures   Procedures     Assessment / Plan     Differential Diagnoses including but not limited to: strain, dislocation    You are being treated for: right knee strain   Helpful over the counter medicine you can use:Alternate  between tylenol  and ibuprofen  for fever/body aches every 3 hours  Ibuprofen : 800 mg every 6 to 8 hours; maximum dose: 3.2 g/day   Tylenol : 325 to 650 mg every 4 to 6 hours as needed or 1 g every 6 hours as needed; maximum dose: 4 g/day   For follow up you should  rest/ice/elevate knee support until pain resolves   -Patient to follow up with primary care doctor or follow up here if symptoms worsening or not resolving as expected.   -Follow up with ED with acute worsening of symptoms.  -Patient verbalized understanding.    -Reviewed discharge instructions that are included here with patient, and printed in AVS.   -Questions answered.  -Risks and benefits of therapy discussed.  Pt agrees and understands.        Tina Daugherty was seen today for knee pain.    Diagnoses and all orders for this  visit:    Knee strain, right, initial encounter    Acute pain of right knee  -     ibuprofen  (ADVIL ) tablet 600 mg  -     XR Knee Right 4+ Views; Future  -     Task for Knee Sleeve         The indications for early follow-up with PCP and return to UC were discussed. Patient/family received education on the working diagnosis, diagnostic uncertainties, and proposed treatment plan. Indications for emergency evaluation in the ED were reviewed. Written and verbal discharge instructions were provided and discussed and all questions from the patient/family were addressed, with no apparent barriers.

## 2024-02-11 ENCOUNTER — Encounter (INDEPENDENT_AMBULATORY_CARE_PROVIDER_SITE_OTHER): Payer: Self-pay | Admitting: Family

## 2024-02-11 ENCOUNTER — Ambulatory Visit (INDEPENDENT_AMBULATORY_CARE_PROVIDER_SITE_OTHER): Admitting: Family

## 2024-02-11 VITALS — BP 118/70 | HR 75 | Temp 97.3°F | Ht 69.0 in | Wt 178.4 lb

## 2024-02-11 DIAGNOSIS — Z0000 Encounter for general adult medical examination without abnormal findings: Secondary | ICD-10-CM

## 2024-02-11 NOTE — Progress Notes (Signed)
 [facility]              Date of Exam: 02/11/2024 1:56 PM   Patient ID: Tina Daugherty is a 29 y.o. female.  Attending Physician: Vanesha Athens, FNP     CC   Chief Complaint   Patient presents with    Annual Exam        HPI   Here today for her annual physical exam. She is overall feeling well at this time. Please see medical history/concerns below.      GENERAL HEALTH MAINTENANCE:  - Dietary habits: fast food, carbs, fruits, veggies, salads  - Exercise routine: walking, coaching softball team- 2 x weekly  - Dental care: Every 6 months   - Sleep quality: Staying sleep, getting 5 hours nightly  - PHQ-2 score: 1     VACCINE HISTORY:  - Tdap (every 10 years):06/2015  - Seasonal flu vaccine: 01/2024  - COVID-19 vaccine:utd     Under age 71: (Please document something)  - HPV vaccine:  Declines         SCREENING EXAMS:        - STD screening: declines  - Hep C screening: Normal. 10/04/2022     - Family history of cancer: no family history of breast, skin or colon cancer     After age 5:  - Pap smear: 02/01/2021-  NEGATIVE FOR INTRAEPITHELIAL LESION OR MALIGNANCY.          After age 53:   - Skin cancer screening: Do not see derm. No concerns         MEDICAL HISTORY/CONCERNS:                 PROBLEM LIST   Problem List[1]     CURRENT MEDICATIONS   Medications Taking[2]     ALLERGIES   Allergies[3]     PAST SURGICAL HISTORY   Past Surgical History[4]     FAMILY HISTORY   Family History[5]      SOCIAL HISTORY   Social History[6]     The following sections were reviewed this encounter by the provider:   Tobacco  Allergies  Meds  Problems  Med Hx  Surg Hx  Fam Hx          ROS   Review of Systems   Constitutional:  Negative for appetite change, chills, diaphoresis and fatigue.   HENT:  Negative for congestion, ear pain, hearing loss, rhinorrhea, sinus pressure, sneezing and sore throat.    Eyes:  Negative for photophobia, pain and visual disturbance.   Respiratory:  Negative for cough, chest tightness,  shortness of breath and wheezing.    Cardiovascular:  Negative for chest pain.   Gastrointestinal:  Negative for abdominal pain, constipation, diarrhea, nausea and vomiting.   Genitourinary:  Negative for dysuria, flank pain and menstrual problem.   Musculoskeletal:  Negative for myalgias.   Skin:  Negative for color change, rash and wound.   Neurological:  Negative for dizziness and headaches.   Psychiatric/Behavioral:  Negative for behavioral problems. The patient is not nervous/anxious.          Vital Signs   BP 118/70 (BP Site: Left arm, Patient Position: Sitting, Cuff Size: Medium)   Pulse 75   Temp 97.3 F (36.3 C) (Temporal)   Ht 1.753 m (5' 9)   Wt 80.9 kg (178 lb 6.4 oz)   LMP 02/10/2024   SpO2 99%   BMI 26.35 kg/m  Physical Exam   Physical Exam  Vitals and nursing note reviewed.   Constitutional:       Appearance: Normal appearance. She is normal weight.   HENT:      Head: Normocephalic and atraumatic.      Right Ear: Tympanic membrane and ear canal normal.      Left Ear: Tympanic membrane and ear canal normal.      Nose: Nose normal.      Mouth/Throat:      Mouth: Mucous membranes are moist.      Pharynx: Oropharynx is clear.   Eyes:      Extraocular Movements: Extraocular movements intact.      Conjunctiva/sclera: Conjunctivae normal.      Pupils: Pupils are equal, round, and reactive to light.   Cardiovascular:      Rate and Rhythm: Normal rate and regular rhythm.      Pulses: Normal pulses.      Heart sounds: No murmur heard.     No friction rub. No gallop.   Pulmonary:      Effort: Pulmonary effort is normal.      Breath sounds: Normal breath sounds. No wheezing, rhonchi or rales.   Abdominal:      General: Abdomen is flat. Bowel sounds are normal.      Palpations: Abdomen is soft.   Musculoskeletal:         General: Normal range of motion.      Cervical back: Normal range of motion and neck supple.   Skin:     General: Skin is warm and dry.      Capillary Refill: Capillary refill takes  less than 2 seconds.   Neurological:      General: No focal deficit present.      Mental Status: She is alert and oriented to person, place, and time. Mental status is at baseline.   Psychiatric:         Mood and Affect: Mood normal.         Behavior: Behavior normal.           Assessment/Plan     Assessment & Plan  Well adult exam  - Overall feeling well at this time  - Declines HPV   - Discussed importance of vaccines  - Discussed importance of well balanced diet and regular exercise  - Encouraged healthy weight maintenance  - Pap due, follows with OBGYN    Orders:    CBC with Differential (Order); Future    Comprehensive Metabolic Panel; Future    Lipid Panel; Future    TSH; Future    Hemoglobin A1C; Future      FOLLOW-UP   Return in about 1 year (around 02/10/2025), or if symptoms worsen or fail to improve.     Signed: Amarys Sliwinski, FNP         [1]   Patient Active Problem List  Diagnosis    Corneal ulcer    Allergic rhinitis due to pollen    Asthma    Disorder of refraction and accommodation    Exercise-induced bronchospasm    Myopia    Adverse reaction to food    IUD (intrauterine device) in place    Eczema    Depression    Chronic allergic conjunctivitis   [2]   Outpatient Medications Marked as Taking for the 02/11/24 encounter (Office Visit) with Khalil Szczepanik, FNP   Medication Sig Dispense Refill    ALBUTEROL  SULFATE HFA IN Inhale 2 puffs into the  lungs as needed.      EPINEPHrine 0.3 MG/0.3ML Solution Auto-injector injection 0.3 mLs (0.3 mg) once (Patient taking differently: 0.3 mLs (0.3 mg) once As needed)      fexofenadine (ALLEGRA) 180 MG tablet Take 1 tablet (180 mg) by mouth daily      levonorgestrel (Mirena, 52 MG,) 20 MCG/24HR IUD 1 Intra Uterine Device (52 mg) by Intrauterine route once     [3]   Allergies  Allergen Reactions    Cat Dander Cough, Itching and Shortness Of Breath     Cats    Horse Epithelium Shortness Of Breath    Peanut (Diagnostic) Anaphylaxis, Anxiety, Hives, Nausea Only, Shortness Of  Breath and Wheezing    Mixed Grasses     Other      All nuts. And cat dander      Pollen Extract    [4]   Past Surgical History:  Procedure Laterality Date    ANTERIOR CRUCIATE LIGAMENT REPAIR  02/10/2015    ACL Reconstruction    left acl     [5]   Family History  Problem Relation Name Age of Onset    Hypertension Father      Diabetes Maternal Grandmother      Diabetes Maternal Grandfather      Seizures Paternal Grandmother      Other Paternal Grandmother          family history of stroke   [6]   Social History  Tobacco Use    Smoking status: Never    Smokeless tobacco: Never   Vaping Use    Vaping status: Never Used   Substance Use Topics    Alcohol use: Yes     Alcohol/week: 4.0 standard drinks of alcohol     Types: 4 Glasses of wine per week    Drug use: Never

## 2024-02-12 LAB — COMPREHENSIVE METABOLIC PANEL
ALT: 11 IU/L (ref 0–32)
AST (SGOT): 23 IU/L (ref 0–40)
Albumin: 4.7 g/dL (ref 4.0–5.0)
Alkaline Phosphatase: 39 IU/L — ABNORMAL LOW (ref 41–116)
BUN / Creatinine Ratio: 10 (ref 9–23)
BUN: 8 mg/dL (ref 6–20)
Bilirubin, Total: 1.1 mg/dL (ref 0.0–1.2)
CO2: 22 mmol/L (ref 20–29)
Calcium: 9.2 mg/dL (ref 8.7–10.2)
Chloride: 104 mmol/L (ref 96–106)
Creatinine: 0.82 mg/dL (ref 0.57–1.00)
Globulin, Total: 2.3 g/dL (ref 1.5–4.5)
Glucose: 76 mg/dL (ref 70–99)
Potassium: 4.6 mmol/L (ref 3.5–5.2)
Protein, Total: 7 g/dL (ref 6.0–8.5)
Sodium: 139 mmol/L (ref 134–144)
eGFR: 101 mL/min/1.73 (ref >59)

## 2024-02-12 LAB — CBC AND DIFFERENTIAL
Baso(Absolute): 0 x10E3/uL (ref 0.0–0.2)
Basophils Automated: 0 %
Eosinophils Absolute: 0 x10E3/uL (ref 0.0–0.4)
Eosinophils Automated: 0 %
Hematocrit: 41.9 % (ref 34.0–46.6)
Hemoglobin: 14.6 g/dL (ref 11.1–15.9)
Immature Granulocytes Absolute: 0 x10E3/uL (ref 0.0–0.1)
Immature Granulocytes: 0 %
Lymphocytes Absolute: 1.1 x10E3/uL (ref 0.7–3.1)
Lymphocytes Automated: 25 %
MCH: 31.3 pg (ref 26.6–33.0)
MCHC: 34.8 g/dL (ref 31.5–35.7)
MCV: 90 fL (ref 79–97)
Monocytes Absolute: 0.3 x10E3/uL (ref 0.1–0.9)
Monocytes: 8 %
Neutrophils Absolute Count: 3 x10E3/uL (ref 1.4–7.0)
Neutrophils: 67 %
Platelets: 256 x10E3/uL (ref 150–450)
RBC: 4.67 x10E6/uL (ref 3.77–5.28)
RDW: 12.3 % (ref 11.7–15.4)
WBC: 4.5 x10E3/uL (ref 3.4–10.8)

## 2024-02-12 LAB — LIPID PANEL
Cholesterol / HDL Ratio: 2.6 ratio (ref 0.0–4.4)
Cholesterol: 159 mg/dL (ref 100–199)
HDL: 62 mg/dL (ref >39)
LDL Chol Calculated (NIH): 88 mg/dL (ref 0–99)
Triglycerides: 42 mg/dL (ref 0–149)
VLDL Calculated: 9 mg/dL (ref 5–40)

## 2024-02-12 LAB — HEMOGLOBIN A1C: Hemoglobin A1C: 4.9 % (ref 4.8–5.6)

## 2024-02-12 LAB — TSH: TSH: 1.1 u[IU]/mL (ref 0.450–4.500)

## 2024-02-17 ENCOUNTER — Ambulatory Visit (INDEPENDENT_AMBULATORY_CARE_PROVIDER_SITE_OTHER): Payer: Self-pay | Admitting: Family

## 2024-02-17 NOTE — Progress Notes (Signed)
 Hi Tina Daugherty,     I have reviewed your labs.     Your electrolytes, kidney function, liver function and blood sugar are normal.   Your cholesterol levels are excellent! Your thyroid function is normal. Your A1c is normal. Your complete blood count is normal.    Please let me know if you have any additional questions at this time.     Take care,    Everrett Lacasse, FNP-C
